# Patient Record
Sex: Male | Born: 1965 | Race: White | Hispanic: No | Marital: Single | Smoking: Current every day smoker
Health system: Southern US, Community
[De-identification: ages and names within clinical notes are randomized; demographics above are authoritative.]

## PROBLEM LIST (undated history)

## (undated) DIAGNOSIS — C439 Malignant melanoma of skin, unspecified: Secondary | ICD-10-CM

## (undated) DIAGNOSIS — M1990 Unspecified osteoarthritis, unspecified site: Secondary | ICD-10-CM

## (undated) DIAGNOSIS — C801 Malignant (primary) neoplasm, unspecified: Secondary | ICD-10-CM

## (undated) HISTORY — PX: WISDOM TOOTH EXTRACTION: SHX21

## (undated) HISTORY — PX: TONSILLECTOMY: SUR1361

---

## 1999-06-15 ENCOUNTER — Emergency Department (HOSPITAL_COMMUNITY): Admission: EM | Admit: 1999-06-15 | Discharge: 1999-06-15 | Payer: Self-pay | Admitting: Emergency Medicine

## 2015-08-14 ENCOUNTER — Emergency Department (HOSPITAL_COMMUNITY)
Admission: EM | Admit: 2015-08-14 | Discharge: 2015-08-14 | Disposition: A | Discharge status: Home / Self Care | Payer: Medicaid Other | Attending: Dermatology | Admitting: Dermatology

## 2015-08-14 ENCOUNTER — Emergency Department (HOSPITAL_COMMUNITY): Payer: Medicaid Other

## 2015-08-14 ENCOUNTER — Encounter (HOSPITAL_COMMUNITY): Payer: Self-pay | Admitting: *Deleted

## 2015-08-14 DIAGNOSIS — Z85820 Personal history of malignant melanoma of skin: Secondary | ICD-10-CM | POA: Diagnosis not present

## 2015-08-14 DIAGNOSIS — F17210 Nicotine dependence, cigarettes, uncomplicated: Secondary | ICD-10-CM | POA: Diagnosis not present

## 2015-08-14 DIAGNOSIS — R0789 Other chest pain: Secondary | ICD-10-CM | POA: Diagnosis present

## 2015-08-14 DIAGNOSIS — Z5321 Procedure and treatment not carried out due to patient leaving prior to being seen by health care provider: Secondary | ICD-10-CM | POA: Diagnosis not present

## 2015-08-14 HISTORY — DX: Malignant melanoma of skin, unspecified: C43.9

## 2015-08-14 HISTORY — DX: Malignant (primary) neoplasm, unspecified: C80.1

## 2015-08-14 LAB — CBC
HCT: 44 % (ref 39.0–52.0)
HEMOGLOBIN: 15.2 g/dL (ref 13.0–17.0)
MCH: 31.2 pg (ref 26.0–34.0)
MCHC: 34.5 g/dL (ref 30.0–36.0)
MCV: 90.3 fL (ref 78.0–100.0)
PLATELETS: 348 10*3/uL (ref 150–400)
RBC: 4.87 MIL/uL (ref 4.22–5.81)
RDW: 13 % (ref 11.5–15.5)
WBC: 14.1 10*3/uL — AB (ref 4.0–10.5)

## 2015-08-14 LAB — BASIC METABOLIC PANEL
Anion gap: 8 (ref 5–15)
BUN: 19 mg/dL (ref 6–20)
CHLORIDE: 105 mmol/L (ref 101–111)
CO2: 25 mmol/L (ref 22–32)
CREATININE: 0.97 mg/dL (ref 0.61–1.24)
Calcium: 9.6 mg/dL (ref 8.9–10.3)
GFR calc non Af Amer: >60 mL/min (ref >60)
Glucose, Bld: 101 mg/dL — ABNORMAL HIGH (ref 65–99)
Potassium: 3.7 mmol/L (ref 3.5–5.1)
SODIUM: 138 mmol/L (ref 135–145)

## 2015-08-14 LAB — I-STAT TROPONIN, ED: TROPONIN I, POC: 0 ng/mL (ref 0.00–0.08)

## 2015-08-14 NOTE — ED Notes (Signed)
Pt stated that he has a babysitter at home with his children and needs to go home. Pt encouraged to come back if his pain came back

## 2015-08-14 NOTE — ED Notes (Signed)
Pt arrives to ED c/o severe chest pain starting at 1730. States it feels like pressure and states he also has cotton mouth.

## 2015-08-28 ENCOUNTER — Emergency Department
Admission: EM | Admit: 2015-08-28 | Discharge: 2015-08-28 | Disposition: A | Discharge status: Home / Self Care | Payer: Medicaid Other | Attending: Emergency Medicine | Admitting: Emergency Medicine

## 2015-08-28 ENCOUNTER — Encounter: Payer: Self-pay | Admitting: Emergency Medicine

## 2015-08-28 DIAGNOSIS — Z85828 Personal history of other malignant neoplasm of skin: Secondary | ICD-10-CM | POA: Diagnosis not present

## 2015-08-28 DIAGNOSIS — F17210 Nicotine dependence, cigarettes, uncomplicated: Secondary | ICD-10-CM | POA: Insufficient documentation

## 2015-08-28 DIAGNOSIS — T7840XA Allergy, unspecified, initial encounter: Secondary | ICD-10-CM | POA: Diagnosis present

## 2015-08-28 DIAGNOSIS — L509 Urticaria, unspecified: Secondary | ICD-10-CM | POA: Diagnosis not present

## 2015-08-28 MED ORDER — DEXAMETHASONE SODIUM PHOSPHATE 10 MG/ML IJ SOLN
10.0000 mg | Freq: Once | INTRAMUSCULAR | Status: AC
  Administered 2015-08-28: 10 mg via INTRAVENOUS
  Filled 2015-08-28: qty 1

## 2015-08-28 NOTE — ED Notes (Addendum)
Pt presents to ED with c/o sudden rash/redness over the back, chest and abdomen. States unknown reason. Pt taken 50mg  benadryl prior to arrival.

## 2015-08-28 NOTE — ED Provider Notes (Signed)
[facility] Emergency Department Provider Note  ____________________________________________  Time seen: Approximately 1:52 AM  I have reviewed the triage vital signs and the nursing notes.   HISTORY  Chief Complaint Allergic Reaction    HPI Vincent Hoover. is a 50 y.o. male with history of chest wall pain and he was recently diagnosed with possible pneumonia at [facility] ED who presents for evaluation of an itchy red rash on his torso and arms and legs.  He was prescribed Levaquin at [facility] for the pneumonia and is on his ninth day of treatment.  He noticed that his hands were itchy yesterday but he did not see a specific rash.  Tonight he took his shirt off and was very surprised to see what looked like hives on his chest and back as well as down his arms and his legs.  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 He denies shortness of breath, chest pain, abdominal pain, nausea, vomiting, diarrhea.  He states that the itching is moderate and it was helped by taking some Benadryl 50 mg by mouth before coming to the emergency department.  He has never had a reaction to medications in the past.  He denies pain.  He occasionally still has some chest wall pain similar to that which he was evaluated for at [facility] but nothing new or different.   Past Medical History  Diagnosis Date  . Cancer ([place])   . Melanoma ([place])     There are no active problems to display for this patient.   History reviewed. No pertinent past surgical history.  No current outpatient prescriptions on file.  Allergies Bee venom  History reviewed. No pertinent family history.  Social History Social History  Substance Use Topics  . Smoking status: Current Every Day Smoker -- 0.50 packs/day    Types: Cigarettes  . Smokeless tobacco: None  . Alcohol Use: No    Review of  Systems Constitutional: No fever/chills Eyes: No visual changes. ENT: No sore throat. Cardiovascular: Occasional chest wall pain on the left side (chronic) Respiratory: Denies shortness of breath. Gastrointestinal: No abdominal pain.  No nausea, no vomiting.  No diarrhea.  No constipation. Genitourinary: Negative for dysuria. Musculoskeletal: Negative for back pain. Skin: itchy red rash on torso and extremities Neurological: Negative for headaches, focal weakness or numbness.  10-point ROS otherwise negative.  ____________________________________________   PHYSICAL EXAM:  VITAL SIGNS: ED Triage Vitals  Enc Vitals Group     BP 08/28/15 0130 122/91 mmHg     Pulse --      Resp 08/28/15 0130 21     Temp 08/28/15 0130 99.4 F (37.4 C)     Temp Source 08/28/15 0130 Oral     SpO2 08/28/15 0130 98 %     Weight 08/28/15 0130 160 lb (72.576 kg)     Height 08/28/15 0130 5\' 11"  (1.803 m)     Head Cir --      Peak Flow --      Pain Score 08/28/15 0049 0     Pain Loc --      Pain Edu? --      Excl. in [place]? --     Constitutional: Alert and oriented. Well appearing and in no acute distress. Eyes: Conjunctivae are normal. PERRL. EOMI. Head: Atraumatic. Nose: No congestion/rhinnorhea. Mouth/Throat: Mucous membranes are moist.  Oropharynx non-erythematous.  Neck: No stridor.  No meningeal signs.   Cardiovascular: Normal rate, regular rhythm. Good peripheral circulation. Grossly normal heart sounds.   Respiratory: Normal respiratory effort.  No retractions. Lungs CTAB. Gastrointestinal: Soft and nontender. No distention.  Musculoskeletal: No lower extremity tenderness nor edema. No gross deformities of extremities. Neurologic:  Normal speech and language. No gross focal neurologic deficits are appreciated.  Skin:  Skin is warm, dry and intact. Urticarial rash on the anterior and posterior torso as well as on his extremities.  Palms are spared.  There are no target lesions.  The rash is  nontender.  There is no specific pattern to the rash. Psychiatric: Mood and affect are normal. Speech and behavior are normal.  ____________________________________________   LABS (all labs ordered are listed, but only abnormal results are displayed)  Labs Reviewed - No data to display ____________________________________________  EKG  None ____________________________________________  RADIOLOGY   No results found.  ____________________________________________   PROCEDURES  Procedure(s) performed:   Procedures   ____________________________________________   INITIAL IMPRESSION / ASSESSMENT AND PLAN / ED COURSE  Pertinent labs & imaging results that were available during my care of the patient were reviewed by me and considered in my medical decision making (see chart for details).  The patient does not meet criteria for anaphylaxis.  He is having an allergic urticarial rash, possibly to the Levaquin, although the exact allergen is unknown.  I encouraged him to not take his final dose of Levaquin, to take over-the-counter Benadryl as needed, and follow up with his primary care doctor at the next available opportunity.  I will give him a single dose of Decadron here that should help suppress his reaction for the next several days.    I gave my usual and customary return precautions.  He understands and agrees with the plan.   ____________________________________________  FINAL CLINICAL IMPRESSION(S) / ED DIAGNOSES  Final diagnoses:  Urticaria  Allergic reaction, initial encounter     MEDICATIONS GIVEN DURING THIS VISIT:  Medications  dexamethasone (DECADRON) injection 10 mg (10 mg Intravenous Given 08/28/15 0214)     NEW OUTPATIENT MEDICATIONS STARTED DURING THIS VISIT:  There are no discharge medications for this patient.     Note:  This document was prepared using Dragon voice recognition software and may include unintentional dictation  errors.   Hinda Kehr, MD 08/28/15 [id number]

## 2015-08-28 NOTE — ED Notes (Signed)
MD at bedside. 

## 2015-08-28 NOTE — Discharge Instructions (Signed)
As we discussed, we do not know exactly what caused to your allergic rash, but we recommend that you do not take the final dose of antibiotics just in case that is the cause of your allergic reaction.  Please use over-the-counter Benadryl as needed and according to label instructions for the next several days.  We gave you a one-time dose of Decadron 10 mg IV which should help with the allergic reaction of the next several days.  Please follow up as an outpatient or return to the emergency department if you develop new or worsening symptoms that concern you.   Hives Hives are itchy, red, swollen areas of the skin. They can vary in size and location on your body. Hives can come and go for hours or several days (acute hives) or for several weeks (chronic hives). Hives do not spread from person to person (noncontagious). They may get worse with scratching, exercise, and emotional stress. CAUSES   Allergic reaction to food, additives, or drugs.  Infections, including the common cold.  Illness, such as vasculitis, lupus, or thyroid disease.  Exposure to sunlight, heat, or cold.  Exercise.  Stress.  Contact with chemicals. SYMPTOMS   Red or white swollen patches on the skin. The patches may change size, shape, and location quickly and repeatedly.  Itching.  Swelling of the hands, feet, and face. This may occur if hives develop deeper in the skin. DIAGNOSIS  Your caregiver can usually tell what is wrong by performing a physical exam. Skin or blood tests may also be done to determine the cause of your hives. In some cases, the cause cannot be determined. TREATMENT  Mild cases usually get better with medicines such as antihistamines. Severe cases may require an emergency epinephrine injection. If the cause of your hives is known, treatment includes avoiding that trigger.  HOME CARE INSTRUCTIONS   Avoid causes that trigger your hives.  Take antihistamines as directed by your caregiver to  reduce the severity of your hives. Non-sedating or low-sedating antihistamines are usually recommended. Do not drive while taking an antihistamine.  Take any other medicines prescribed for itching as directed by your caregiver.  Wear loose-fitting clothing.  Keep all follow-up appointments as directed by your caregiver. SEEK MEDICAL CARE IF:   You have persistent or severe itching that is not relieved with medicine.  You have painful or swollen joints. SEEK IMMEDIATE MEDICAL CARE IF:   You have a fever.  Your tongue or lips are swollen.  You have trouble breathing or swallowing.  You feel tightness in the throat or chest.  You have abdominal pain. These problems may be the first sign of a life-threatening allergic reaction. Call your local emergency services (911 in [place] MAKE SURE YOU:   Understand these instructions.  Will watch your condition.  Will get help right away if you are not doing well or get worse.   This information is not intended to replace advice given to you by your health care provider. Make sure you discuss any questions you have with your health care provider.   Document Released: 02/01/2005 Document Revised: 02/06/2013 Document Reviewed: 04/27/2011 Elsevier Interactive Patient Education [employer].

## 2015-08-28 NOTE — ED Notes (Signed)

## 2015-10-06 ENCOUNTER — Emergency Department: Payer: Medicaid Other

## 2015-10-06 ENCOUNTER — Inpatient Hospital Stay
Admission: EM | Admit: 2015-10-06 | Discharge: 2015-10-13 | DRG: 871 | Disposition: A | Discharge status: Home / Self Care | Payer: Medicaid Other | Attending: Specialist | Admitting: Specialist

## 2015-10-06 ENCOUNTER — Inpatient Hospital Stay: Payer: Medicaid Other

## 2015-10-06 DIAGNOSIS — J439 Emphysema, unspecified: Secondary | ICD-10-CM | POA: Diagnosis present

## 2015-10-06 DIAGNOSIS — J90 Pleural effusion, not elsewhere classified: Secondary | ICD-10-CM

## 2015-10-06 DIAGNOSIS — J9621 Acute and chronic respiratory failure with hypoxia: Secondary | ICD-10-CM | POA: Diagnosis present

## 2015-10-06 DIAGNOSIS — Z85820 Personal history of malignant melanoma of skin: Secondary | ICD-10-CM | POA: Diagnosis not present

## 2015-10-06 DIAGNOSIS — Z833 Family history of diabetes mellitus: Secondary | ICD-10-CM | POA: Diagnosis not present

## 2015-10-06 DIAGNOSIS — J189 Pneumonia, unspecified organism: Secondary | ICD-10-CM | POA: Diagnosis present

## 2015-10-06 DIAGNOSIS — K5900 Constipation, unspecified: Secondary | ICD-10-CM | POA: Diagnosis present

## 2015-10-06 DIAGNOSIS — G8929 Other chronic pain: Secondary | ICD-10-CM | POA: Diagnosis present

## 2015-10-06 DIAGNOSIS — Z8701 Personal history of pneumonia (recurrent): Secondary | ICD-10-CM | POA: Diagnosis not present

## 2015-10-06 DIAGNOSIS — Z9889 Other specified postprocedural states: Secondary | ICD-10-CM

## 2015-10-06 DIAGNOSIS — R0600 Dyspnea, unspecified: Secondary | ICD-10-CM | POA: Diagnosis present

## 2015-10-06 DIAGNOSIS — A419 Sepsis, unspecified organism: Principal | ICD-10-CM | POA: Diagnosis present

## 2015-10-06 DIAGNOSIS — M1990 Unspecified osteoarthritis, unspecified site: Secondary | ICD-10-CM | POA: Diagnosis present

## 2015-10-06 DIAGNOSIS — J869 Pyothorax without fistula: Secondary | ICD-10-CM | POA: Diagnosis present

## 2015-10-06 DIAGNOSIS — M549 Dorsalgia, unspecified: Secondary | ICD-10-CM | POA: Diagnosis present

## 2015-10-06 DIAGNOSIS — J918 Pleural effusion in other conditions classified elsewhere: Secondary | ICD-10-CM | POA: Diagnosis present

## 2015-10-06 DIAGNOSIS — F17210 Nicotine dependence, cigarettes, uncomplicated: Secondary | ICD-10-CM | POA: Diagnosis present

## 2015-10-06 DIAGNOSIS — J939 Pneumothorax, unspecified: Secondary | ICD-10-CM

## 2015-10-06 HISTORY — DX: Unspecified osteoarthritis, unspecified site: M19.90

## 2015-10-06 LAB — BODY FLUID CELL COUNT WITH DIFFERENTIAL
EOS FL: 0 %
Lymphs, Fluid: 9 %
Monocyte-Macrophage-Serous Fluid: 2 %
Neutrophil Count, Fluid: 89 %
Other Cells, Fluid: 0 %
WBC FLUID: 2200 uL

## 2015-10-06 LAB — BASIC METABOLIC PANEL
Anion gap: 10 (ref 5–15)
BUN: 11 mg/dL (ref 6–20)
CALCIUM: 8.7 mg/dL — AB (ref 8.9–10.3)
CHLORIDE: 101 mmol/L (ref 101–111)
CO2: 23 mmol/L (ref 22–32)
CREATININE: 0.77 mg/dL (ref 0.61–1.24)
GFR calc Af Amer: >60 mL/min (ref >60)
GFR calc non Af Amer: >60 mL/min (ref >60)
Glucose, Bld: 157 mg/dL — ABNORMAL HIGH (ref 65–99)
Potassium: 3.2 mmol/L — ABNORMAL LOW (ref 3.5–5.1)
SODIUM: 134 mmol/L — AB (ref 135–145)

## 2015-10-06 LAB — CBC WITH DIFFERENTIAL/PLATELET
BASOS ABS: 0.2 10*3/uL — AB (ref 0–0.1)
BASOS PCT: 1 %
EOS ABS: 0.6 10*3/uL (ref 0–0.7)
Eosinophils Relative: 3 %
HCT: 40 % (ref 40.0–52.0)
HEMOGLOBIN: 13.6 g/dL (ref 13.0–18.0)
LYMPHS ABS: 2.1 10*3/uL (ref 1.0–3.6)
LYMPHS PCT: 10 %
MCH: 30.5 pg (ref 26.0–34.0)
MCHC: 34 g/dL (ref 32.0–36.0)
MCV: 89.9 fL (ref 80.0–100.0)
MONO ABS: 2.3 10*3/uL — AB (ref 0.2–1.0)
Monocytes Relative: 11 %
NEUTROS ABS: 15.8 10*3/uL — AB (ref 1.4–6.5)
Neutrophils Relative %: 75 %
Platelets: 474 10*3/uL — ABNORMAL HIGH (ref 150–440)
RBC: 4.46 MIL/uL (ref 4.40–5.90)
RDW: 13.5 % (ref 11.5–14.5)
WBC: 21 10*3/uL — ABNORMAL HIGH (ref 3.8–10.6)

## 2015-10-06 LAB — LACTATE DEHYDROGENASE, PLEURAL OR PERITONEAL FLUID: LD, Fluid: 529 U/L — ABNORMAL HIGH (ref 3–23)

## 2015-10-06 LAB — LACTIC ACID, PLASMA: Lactic Acid, Venous: 0.8 mmol/L (ref 0.5–1.9)

## 2015-10-06 LAB — RAPID HIV SCREEN (HIV 1/2 AB+AG)
HIV 1/2 ANTIBODIES: NONREACTIVE
HIV-1 P24 ANTIGEN - HIV24: NONREACTIVE

## 2015-10-06 LAB — TROPONIN I: Troponin I: <0.03 ng/mL (ref <0.03)

## 2015-10-06 LAB — AMYLASE, BODY FLUID: Amylase, Fluid: 29 U/L

## 2015-10-06 LAB — LACTATE DEHYDROGENASE: LDH: 148 U/L (ref 98–192)

## 2015-10-06 LAB — GLUCOSE, SEROUS FLUID: GLUCOSE FL: 62 mg/dL

## 2015-10-06 LAB — BRAIN NATRIURETIC PEPTIDE: B NATRIURETIC PEPTIDE 5: 66 pg/mL (ref 0.0–100.0)

## 2015-10-06 MED ORDER — IOPAMIDOL (ISOVUE-370) INJECTION 76%
75.0000 mL | Freq: Once | INTRAVENOUS | Status: AC | PRN
  Administered 2015-10-06: 75 mL via INTRAVENOUS

## 2015-10-06 MED ORDER — OXYCODONE-ACETAMINOPHEN 5-325 MG PO TABS
1.0000 | ORAL_TABLET | ORAL | Status: DC | PRN
  Administered 2015-10-07 – 2015-10-12 (×22): 1 via ORAL
  Filled 2015-10-06 (×22): qty 1

## 2015-10-06 MED ORDER — SODIUM CHLORIDE 0.9 % IV SOLN
3.0000 g | Freq: Four times a day (QID) | INTRAVENOUS | Status: DC
  Administered 2015-10-06 – 2015-10-13 (×27): 3 g via INTRAVENOUS
  Filled 2015-10-06 (×30): qty 3

## 2015-10-06 MED ORDER — MORPHINE SULFATE (PF) 2 MG/ML IV SOLN
2.0000 mg | INTRAVENOUS | Status: DC | PRN
  Administered 2015-10-06 – 2015-10-07 (×3): 2 mg via INTRAVENOUS
  Filled 2015-10-06 (×3): qty 1

## 2015-10-06 MED ORDER — OXYCODONE-ACETAMINOPHEN 5-325 MG PO TABS
1.0000 | ORAL_TABLET | Freq: Four times a day (QID) | ORAL | Status: DC | PRN
  Administered 2015-10-06: 1 via ORAL
  Filled 2015-10-06: qty 1

## 2015-10-06 MED ORDER — OXYCODONE-ACETAMINOPHEN 5-325 MG PO TABS: 1.0000 | ORAL_TABLET | Freq: Four times a day (QID) | ORAL | Status: DC | PRN

## 2015-10-06 MED ORDER — VANCOMYCIN HCL IN DEXTROSE 1-5 GM/200ML-% IV SOLN
1000.0000 mg | Freq: Once | INTRAVENOUS | Status: AC
  Administered 2015-10-06: 1000 mg via INTRAVENOUS
  Filled 2015-10-06: qty 200

## 2015-10-06 MED ORDER — KETOROLAC TROMETHAMINE 30 MG/ML IJ SOLN
30.0000 mg | Freq: Once | INTRAMUSCULAR | Status: AC
  Administered 2015-10-06: 30 mg via INTRAVENOUS
  Filled 2015-10-06: qty 1

## 2015-10-06 MED ORDER — HEPARIN SODIUM (PORCINE) 5000 UNIT/ML IJ SOLN
5000.0000 [IU] | Freq: Three times a day (TID) | INTRAMUSCULAR | Status: DC
  Administered 2015-10-06 – 2015-10-07 (×2): 5000 [IU] via SUBCUTANEOUS
  Filled 2015-10-06 (×2): qty 1

## 2015-10-06 MED ORDER — IPRATROPIUM-ALBUTEROL 0.5-2.5 (3) MG/3ML IN SOLN
3.0000 mL | RESPIRATORY_TRACT | Status: DC
  Administered 2015-10-06 – 2015-10-07 (×6): 3 mL via RESPIRATORY_TRACT
  Filled 2015-10-06 (×6): qty 3

## 2015-10-06 MED ORDER — PIPERACILLIN-TAZOBACTAM 3.375 G IVPB
3.3750 g | Freq: Once | INTRAVENOUS | Status: AC
  Administered 2015-10-06: 3.375 g via INTRAVENOUS
  Filled 2015-10-06: qty 50

## 2015-10-06 MED ORDER — LORAZEPAM 2 MG/ML IJ SOLN
1.0000 mg | Freq: Once | INTRAMUSCULAR | Status: AC
  Administered 2015-10-06: 1 mg via INTRAVENOUS
  Filled 2015-10-06: qty 1

## 2015-10-06 NOTE — ED Triage Notes (Signed)
Pt c/o cough with chest pain and increased SOB since last Tuesday and was seen at [facility] and put on abx states he is getting worse instead of better, states he was treated for pneumonia in july

## 2015-10-06 NOTE — ED Provider Notes (Signed)
[facility] Emergency Department Provider Note        Time seen: ----------------------------------------- 8:41 AM on 10/06/2015 -----------------------------------------    I have reviewed the triage vital signs and the nursing notes.   HISTORY  Chief Complaint Chest Pain and Shortness of Breath    HPI Vincent Hoover. is a 50 y.o. male who presents to ER for cough with left-sided chest pain radiates into his shoulder and increased shortness of breath since last Tuesday. Patient states he was seen at [facility] on Wednesday of last week and placed on antibiotics but he states he is getting worse instead of better. States she was treated for pneumonia in July. He denies having fevers, denies purulent cough. Patient states breathing hurts his left side. Nothing has made his symptoms better, tramadol does not help.Patient states for the last 2 weeks he's had night sweats and referred pain from his chest to his left shoulder. He has also lost about 7 pounds over the last 2 weeks.   Past Medical History:  Diagnosis Date  . Cancer ([place])   . Melanoma ([place])     There are no active problems to display for this patient.   Past Surgical History:  Procedure Laterality Date  . TONSILLECTOMY    . WISDOM TOOTH EXTRACTION      Allergies Bee venom  Social History Social History  Substance Use Topics  . Smoking status: Current Every Day Smoker    Packs/day: 0.50    Types: Cigarettes  . Smokeless tobacco: Never Used  . Alcohol use No    Review of Systems Constitutional: Negative for fever. Cardiovascular:Positive for left-sided chest pain Respiratory: Positive shortness of breath, cough Gastrointestinal: Negative for abdominal pain, vomiting and diarrhea. Genitourinary: Negative for dysuria. Musculoskeletal: Negative for back pain. Skin: Negative for rash. Neurological: Negative for headaches, focal weakness or numbness.  10-point ROS otherwise  negative.  ____________________________________________   PHYSICAL EXAM:  VITAL SIGNS: ED Triage Vitals [10/06/15 0828]  Enc Vitals Group     BP (!) 148/92     Pulse Rate (!) 132     Resp (!) 26     Temp 99.1 F (37.3 C)     Temp Source Oral     SpO2 92 %     Weight 160 lb (72.6 kg)     Height 5\' 11"  (1.803 m)     Head Circumference      Peak Flow      Pain Score 10     Pain Loc      Pain Edu?      Excl. in [place]?     Constitutional: Alert and oriented. Anxious, mild to moderate distress Eyes: Conjunctivae are normal. PERRL. Normal extraocular movements. ENT   Head: Normocephalic and atraumatic.   Nose: No congestion/rhinnorhea.   Mouth/Throat: Mucous membranes are moist.   Neck: No stridor. Cardiovascular: Normal rate, regular rhythm. No murmurs, rubs, or gallops. Respiratory: Tachypnea with diminished breath sounds on the left, occasional rhonchi. Gastrointestinal: Soft and nontender. Normal bowel sounds Musculoskeletal: Nontender with normal range of motion in all extremities. No lower extremity tenderness nor edema. Neurologic:  Normal speech and language. No gross focal neurologic deficits are appreciated.  Skin:  Skin is warm, dry and intact. No rash noted. Psychiatric: Anxious mood and affect. ____________________________________________  EKG: Interpreted by me. Sinus tachycardia with a rate of 129 bpm, normal PR interval, normal QRS, normal QT interval. Normal axis.  ____________________________________________  ED COURSE:  Pertinent labs &  imaging results that were available during my care of the patient were reviewed by me and considered in my medical decision making (see chart for details). Clinical Course  Patient presents to ER with dyspnea of uncertain etiology. We will check basic labs and imaging. He'll receive IV Ativan and Toradol.  Procedures ____________________________________________   LABS (pertinent positives/negatives)  Labs  Reviewed  CBC WITH DIFFERENTIAL/PLATELET - Abnormal; Notable for the following:       Result Value   WBC 21.0 (*)    Platelets 474 (*)    Neutro Abs 15.8 (*)    Monocytes Absolute 2.3 (*)    Basophils Absolute 0.2 (*)    All other components within normal limits  BASIC METABOLIC PANEL - Abnormal; Notable for the following:    Sodium 134 (*)    Potassium 3.2 (*)    Glucose, Bld 157 (*)    Calcium 8.7 (*)    All other components within normal limits  CULTURE, BLOOD (ROUTINE X 2)  CULTURE, BLOOD (ROUTINE X 2)  BRAIN NATRIURETIC PEPTIDE  TROPONIN I  RAPID HIV SCREEN (HIV 1/2 AB+AG)  LACTATE DEHYDROGENASE  LACTIC ACID, PLASMA  LACTIC ACID, PLASMA  LACTATE DEHYDROGENASE  RAPID HIV SCREEN (HIV 1/2 AB+AG)  CRITICAL CARE Performed by: Earleen Newport   Total critical care time: 30 minutes  Critical care time was exclusive of separately billable procedures and treating other patients.  Critical care was necessary to treat or prevent imminent or life-threatening deterioration.  Critical care was time spent personally by me on the following activities: development of treatment plan with patient and/or surrogate as well as nursing, discussions with consultants, evaluation of patient's response to treatment, examination of patient, obtaining history from patient or surrogate, ordering and performing treatments and interventions, ordering and review of laboratory studies, ordering and review of radiographic studies, pulse oximetry and re-evaluation of patient's condition.   RADIOLOGY Images were viewed by me  Chest x-ray IMPRESSION: Interval development of large left pleural effusion with probable associated airspace consolidation, obscuring 2/3 of the left hemithorax.  No evidence of pneumothorax. CT of the chest with contrast:  IMPRESSION: Large left pleural effusion appears loculated.  Patchy airspace disease in all lobes of the right lung most consistent with  pneumonia.  Emphysema.  Small right hilar lymph node is likely reactive. ____________________________________________  FINAL ASSESSMENT AND PLAN  Dyspnea, large left pleural effusion, Pneumonia  Plan: Patient with labs and imaging as dictated above. Patient presents with dyspnea, large pleural effusion is noted on the left side of his chest. We will obtain CT imaging for further evaluation. Of concern for etiology other than infection such as cancer. He will likely need a pleurocentesis. We have ordered cultures, given IV vancomycin and Zosyn. He will also need pulmonology consultation.   Earleen Newport, MD   Note: This dictation was prepared with Dragon dictation. Any transcriptional errors that result from this process are unintentional    Earleen Newport, MD 10/06/15 1030

## 2015-10-06 NOTE — ED Notes (Signed)
Patient transported to CT 

## 2015-10-06 NOTE — Progress Notes (Signed)
Pharmacy Antibiotic Note  Vincent Hoover. is a 50 y.o. male admitted on 10/06/2015 with pneumonia.  Pharmacy has been consulted for Unasyn dosing.  Plan: Will order Unasyn 3g IV every 6 hours, starting at 1900.  Received one time doses of Vancomycin and Zosyn in the ED.    Height: 5\' 11"  (180.3 cm) Weight: 160 lb (72.6 kg) IBW/kg (Calculated) : 75.3  Temp (24hrs), Avg:100.5 F (38.1 C), Min:99.1 F (37.3 C), Max:101.9 F (38.8 C)   Recent Labs Lab 10/06/15 0842 10/06/15 0956  WBC 21.0*  --   CREATININE 0.77  --   LATICACIDVEN  --  0.8    Estimated Creatinine Clearance: 116 mL/min (by C-G formula based on SCr of 0.8 mg/dL).    Allergies  Allergen Reactions  . Bee Venom Anaphylaxis    Antimicrobials this admission: Vancomycin 8/21 >> 8/21 Zosyn 8/21 >> 8/21 Unasyn 8/21 >>   Dose adjustments this admission: No dose adjustments needed at this time.   Microbiology results: 8/21 BCx: in process 8/21 Sputum: needs to be collected  Thank you for allowing pharmacy to be a part of this patient's care.  Loree Fee 10/06/2015 12:36 PM

## 2015-10-06 NOTE — ED Notes (Signed)
Per [place] RN, Claiborne Billings from [place] will transport patient to [place] for procedure before going to floor.

## 2015-10-06 NOTE — H&P (Signed)
[facility] NAME: Vincent Hoover    MR#:  [id number]  DATE OF BIRTH:  1965-07-10  DATE OF ADMISSION:  10/06/2015  PRIMARY CARE PHYSICIAN: No PCP Per Patient   REQUESTING/REFERRING PHYSICIAN: Gwyndolyn Saxon  CHIEF COMPLAINT:   Chief Complaint  Patient presents with  . Chest Pain  . Shortness of Breath    HISTORY OF PRESENT ILLNESS: Vincent Hoover  is a 50 y.o. male with a known history of Chronic arthritis, chronic back pain, history of melanoma, is a chronic smoker and is in first week of July went to [facility] for left-sided chest pain with breathing on CT scan he was found to have pneumonia with some pleural effusion and was given Levaquin orally and discharged home. After taking 9 tablets of Levaquin he had some hives on his back so he came to emergency room or here and advised to stop taking Levaquin as he almost finished the course. Because of his chest pain he also had addressed treadmill stress test done on 27th July at [facility] and which was negative for any stress-induced changes or coronary artery disease. 5 days ago again had pain in his left side of the chest so he went to [facility] emergency room where CT scan showed pleural effusion and pneumonia, so he was given azithromycin for 5 days and sent home. He finished the course and still continued to have the chest pain so came to emergency room here today. In ER her CT scan of the chest showed left-sided moderate possibly loculated pleural effusion and a right sided infiltrate, compared to the CT scan in [facility] which had left-sided possible nodule or infiltrate in the lung. He was also having fever, tachycardia and hypoxia with elevated white blood cell count so given his admission.  PAST MEDICAL HISTORY:   Past Medical History:  Diagnosis Date  . Arthritis   . Cancer ([place])   . Melanoma ([place])     PAST SURGICAL HISTORY: Past Surgical History:  Procedure Laterality Date  .  TONSILLECTOMY    . WISDOM TOOTH EXTRACTION      SOCIAL HISTORY:  Social History  Substance Use Topics  . Smoking status: Current Every Day Smoker    Packs/day: 0.50    Types: Cigarettes  . Smokeless tobacco: Never Used  . Alcohol use No    FAMILY HISTORY:  Family History  Problem Relation Age of Onset  . Diabetes Maternal Grandfather   . Cancer Paternal Uncle   . Cancer Paternal Aunt     DRUG ALLERGIES:  Allergies  Allergen Reactions  . Bee Venom Anaphylaxis    REVIEW OF SYSTEMS:   CONSTITUTIONAL: Positive for fever, no fatigue or weakness.  EYES: No blurred or double vision.  EARS, NOSE, AND THROAT: No tinnitus or ear pain.  RESPIRATORY: No cough, some shortness of breath, no wheezing or hemoptysis.  CARDIOVASCULAR: Left-sided chest pain with breathing, no orthopnea, edema.  GASTROINTESTINAL: No nausea, vomiting, diarrhea or abdominal pain.  GENITOURINARY: No dysuria, hematuria.  ENDOCRINE: No polyuria, nocturia,  HEMATOLOGY: No anemia, easy bruising or bleeding SKIN: No rash or lesion. MUSCULOSKELETAL: No joint pain or arthritis.   NEUROLOGIC: No tingling, numbness, weakness.  PSYCHIATRY: No anxiety or depression.   MEDICATIONS AT HOME:  Prior to Admission medications   Not on File      PHYSICAL EXAMINATION:   VITAL SIGNS: Blood pressure 103/88, pulse 100, temperature (!) 101.9 F (38.8 C), temperature source Oral, resp. rate Marland Kitchen)  21, height 5\' 11"  (1.803 m), weight 72.6 kg (160 lb), SpO2 99 %.  GENERAL:  50 y.o.-year-old patient lying in the bed with no acute distress.  EYES: Pupils equal, round, reactive to light and accommodation. No scleral icterus. Extraocular muscles intact.  HEENT: Head atraumatic, normocephalic. Oropharynx and nasopharynx clear.  NECK:  Supple, no jugular venous distention. No thyroid enlargement, no tenderness.  LUNGS: Decreased breath sound on left side lower, no wheezing, some crepitation on the right side. No use of accessory  muscles of respiration.  CARDIOVASCULAR: S1, S2 normal. No murmurs, rubs, or gallops.  ABDOMEN: Soft, nontender, nondistended. Bowel sounds present. No organomegaly or mass.  EXTREMITIES: No pedal edema, cyanosis, or clubbing.  NEUROLOGIC: Cranial nerves II through XII are intact. Muscle strength 5/5 in all extremities. Sensation intact. Gait not checked.  PSYCHIATRIC: The patient is alert and oriented x 3.  SKIN: No obvious rash, lesion, or ulcer.   LABORATORY PANEL:   CBC  Recent Labs Lab 10/06/15 0842  WBC 21.0*  HGB 13.6  HCT 40.0  PLT 474*  MCV 89.9  MCH 30.5  MCHC 34.0  RDW 13.5  LYMPHSABS 2.1  MONOABS 2.3*  EOSABS 0.6  BASOSABS 0.2*   ------------------------------------------------------------------------------------------------------------------  Chemistries   Recent Labs Lab 10/06/15 0842  NA 134*  K 3.2*  CL 101  CO2 23  GLUCOSE 157*  BUN 11  CREATININE 0.77  CALCIUM 8.7*   ------------------------------------------------------------------------------------------------------------------ estimated creatinine clearance is 116 mL/min (by C-G formula based on SCr of 0.8 mg/dL). ------------------------------------------------------------------------------------------------------------------ No results for input(s): TSH, T4TOTAL, T3FREE, THYROIDAB in the last 72 hours.  Invalid input(s): FREET3   Coagulation profile No results for input(s): INR, PROTIME in the last 168 hours. ------------------------------------------------------------------------------------------------------------------- No results for input(s): DDIMER in the last 72 hours. -------------------------------------------------------------------------------------------------------------------  Cardiac Enzymes  Recent Labs Lab 10/06/15 0842  TROPONINI <0.03   ------------------------------------------------------------------------------------------------------------------ Invalid  input(s): POCBNP  ---------------------------------------------------------------------------------------------------------------  Urinalysis No results found for: COLORURINE, APPEARANCEUR, LABSPEC, PHURINE, GLUCOSEU, HGBUR, BILIRUBINUR, KETONESUR, PROTEINUR, UROBILINOGEN, NITRITE, LEUKOCYTESUR   RADIOLOGY: Dg Chest 2 View  Result Date: 10/06/2015 CLINICAL DATA:  Left-sided chest pain and shortness of breath and fever for 5 days. EXAM: CHEST  2 VIEW COMPARISON:  08/14/2015 FINDINGS: The cardiomediastinal silhouette is largely obscured by a large left pleural effusion and/or airspace consolidation, obscuring most of the left hemithorax. A rounded density overlying the anterior mediastinum on the lateral view likely represents loculated effusion. There is no evidence of pneumothorax. The right lung is clear. Osseous structures are without acute abnormality. Soft tissues are grossly normal. IMPRESSION: Interval development of large left pleural effusion with probable associated airspace consolidation, obscuring 2/3 of the left hemithorax. No evidence of pneumothorax. Electronically Signed   By: Fidela Salisbury M.D.   On: 10/06/2015 09:27   Ct Chest W Contrast  Result Date: 10/06/2015 CLINICAL DATA:  Shortness of breath and cough for 1 week. Pleural effusion on chest radiographs today. EXAM: CT CHEST WITH CONTRAST TECHNIQUE: Multidetector CT imaging of the chest was performed during intravenous contrast administration. CONTRAST:  75 cc Isovue 370. COMPARISON:  PA and lateral chest earlier today and 08/14/2015. FINDINGS: Cardiovascular: Heart size is normal. No pericardial effusion. No calcific atherosclerotic vascular disease is identified. Mediastinum/Nodes: A right hilar lymph node on image 70 measures 1.2 cm short axis dimension. Small mediastinal lymph nodes which are not pathologically enlarged by CT size criteria are seen. Lungs/Pleura: Large left pleural effusion appears at least partially  loculated. No right pleural effusion is identified.  Patchy airspace disease is seen in all lobes of the right lung. Extensive compressive atelectasis on the left is identified. Mucous is noted within the trachea. Centrilobular emphysematous change is seen. Upper Abdomen: Unremarkable. Musculoskeletal: No bony abnormality is identified. IMPRESSION: Large left pleural effusion appears loculated. Patchy airspace disease in all lobes of the right lung most consistent with pneumonia. Emphysema. Small right hilar lymph node is likely reactive. Electronically Signed   By: Inge Rise M.D.   On: 10/06/2015 10:25    EKG: Orders placed or performed during the hospital encounter of 10/06/15  . EKG 12-Lead  . EKG 12-Lead  . EKG 12-Lead  . EKG 12-Lead  . ED EKG  . ED EKG    IMPRESSION AND PLAN:  * Acute hypoxic respiratory failure with sepsis secondary to recurrent pneumonia and pleural effusion   He was already treated with a course of Levaquin for 9 days and azithromycin for 5 days in last 6 weeks.   I will treat with IV Unasyn for now and send him for thoracentesis to get the sample from pleural effusion. We may help to set him up with the follow-up in pulmonary department after discharge for dissolution of his pneumonia and to check for any underlying tumors.   Pulmonary consult called in inpatient.   Sputum culture was ordered.   Percocet ordered for pain control.  * Left-sided chest pain   He has a negative treadmill stress test done at [facility] on 27th of July, 2017.   Pain is secondary to his pleural effusion, give Percocet for symptomatic control.  * Active smoking   Counseled to quit smoking for 4 minutes.   All the records are reviewed and case discussed with ED provider. Management plans discussed with the patient, family and they are in agreement.  CODE STATUS: Full code Code Status History    This patient does not have a recorded code status. Please follow your organizational  policy for patients in this situation.       TOTAL TIME TAKING CARE OF THIS PATIENT: 50 minutes.    Vaughan Basta M.D on 10/06/2015   Between 7am to 6pm - Pager - [phone]  After 6pm go to [email or web address] - password [email or web address] [facility]  Office  [phone]  CC: Primary care physician; No PCP Per Patient   Note: This dictation was prepared with Dragon dictation along with smaller phrase technology. Any transcriptional errors that result from this process are unintentional.

## 2015-10-06 NOTE — Consult Note (Signed)
Name: Vincent Hoover. MRN: [id number] DOB: 1965/07/11    ADMISSION DATE:  10/06/2015 CONSULTATION DATE:  10/06/15  REFERRING MD :  Anselm Jungling  CHIEF COMPLAINT:  Recurrent PNA And pleural effussion  BRIEF PATIENT DESCRIPTION: 50 year old male with recurrent PNA and left sided pleural effussion  SIGNIFICANT EVENTS  8/21 Patient admitted on 8/21 with recurrent PNA and  pleural effusion  STUDIES:  8/21 Ultrasound guided thoracentesis>> left thoracentesis yielding 1 L ofpleural fluid. 8/21 CT chest with contrast>>Large left pleural effusion appears loculated.Patchy airspace disease in all lobes of the right lung mostconsistent with pneumonia. 8/21 Pleural fluid culture>>    HISTORY OF PRESENT ILLNESS:  Vincent Hoover is a 50 yo male with a known Hx of melanoma, chronic back pain, current smoker. Patient was diagnosed and treated for PNA and pleural efusion with levaquin.  Towards the end of his antibiotic course he had some hives, therefore he was advised to stop taking Levaquin. Patient started experiencing some left sided chest pain again 5 days ago, CT of the chest at [facility] was concerning for left sided loculated pleural effusion and PNA.  He was given a 5 days course of azithromycin, he finished the course but continued to have chest pain so therefore he decided to come to [facility] on 8/21. His CT of the chest was concerning for left sided loculated pleural effusion and right sided infilterate.  Patient underwent left sided thoracentesis and removed 1l of pleural fluid. Patient also having fever , tachycardia and hypoxia with leukocytosis. Therefore PCCM team was consult for further management PAST MEDICAL HISTORY :   has a past medical history of Arthritis; Cancer ([place]); and Melanoma ([place]).  has a past surgical history that includes Tonsillectomy and Wisdom tooth extraction. Prior to Admission medications   Not on File   Allergies  Allergen Reactions  . Bee Venom Anaphylaxis     FAMILY HISTORY:  family history includes Cancer in his paternal aunt and paternal uncle; Diabetes in his maternal grandfather. SOCIAL HISTORY:  reports that he has been smoking Cigarettes.  He has been smoking about 0.50 packs per day. He has never used smokeless tobacco. He reports that he does not drink alcohol or use drugs.  REVIEW OF SYSTEMS:   Constitutional:  fever, chills, weight loss, malaise/fatigue and diaphoresis.  HENT: Negative for hearing loss, ear pain, nosebleeds, congestion, sore throat, neck pain, tinnitus and ear discharge.   Eyes: Negative for blurred vision, double vision, photophobia, pain, discharge and redness.  Respiratory:cough, hemoptysis, sputum production, shortness of breath, wheezing and stridor.   Cardiovascular:  chest pain, palpitations, orthopnea, claudication, leg swelling and PND.  Gastrointestinal: Negative for heartburn, nausea, vomiting, abdominal pain, diarrhea, constipation, blood in stool and melena.  Genitourinary: Negative for dysuria, urgency, frequency, hematuria and flank pain.  Musculoskeletal: Negative for myalgias, back pain, joint pain and falls.  Skin: Negative for itching and rash.  Neurological: Negative for dizziness, tingling, tremors, sensory change, speech change, focal weakness, seizures, loss of consciousness, weakness and headaches.  Endo/Heme/Allergies: Negative for environmental allergies and polydipsia. Does not bruise/bleed easily.  SUBJECTIVE:  Patient states that he is having left sided chest pain, intermittent fevers, and brownish tinged sputum. VITAL SIGNS: Temp:  [98 F (36.7 C)-101.9 F (38.8 C)] 98.3 F (36.8 C) (08/21 1440) Pulse Rate:  [91-132] 92 (08/21 1440) Resp:  [18-29] 18 (08/21 1440) BP: (103-183)/(61-92) 121/66 (08/21 1440) SpO2:  [92 %-99 %] 95 % (08/21 1605) Weight:  [149 lb 6.4 oz (67.8  kg)-160 lb (72.6 kg)] 149 lb 6.4 oz (67.8 kg) (08/21 1440)  PHYSICAL EXAMINATION: General:  Middle aged white  male, in no acute distress, found on 2l of o2 Neuro:  Awake, alert, oriented, follows command, no focal deficits. HEENT:  Atraumatic, normocephalic, no discharge, no JVD appreciated Cardiovascular:  S1S2, regular, no MRG noted Lungs:  Diminished right lower lobe, no wheezes, crackles or rhonchi noted Abdomen:soft, nontender, active bowel sounds Musculoskeletal:  No inflammation/deformity noted Skin:  Grossly intact   Recent Labs Lab 10/06/15 0842  NA 134*  K 3.2*  CL 101  CO2 23  BUN 11  CREATININE 0.77  GLUCOSE 157*    Recent Labs Lab 10/06/15 0842  HGB 13.6  HCT 40.0  WBC 21.0*  PLT 474*   Dg Chest 1 View  Result Date: 10/06/2015 CLINICAL DATA:  Status post left thoracentesis EXAM: CHEST 1 VIEW COMPARISON:  Film from earlier in the same day FINDINGS: There has been significant decrease in the degree of left pleural effusion although a moderate effusion remains. Patient terminated the procedure prior to complete drainage. Additionally multiple loculations were noted during the drainage which will likely preclude complete evacuation. Patchy atelectatic changes are noted in the bases bilaterally similar to that seen on prior CT examination. Pneumothorax is noted. IMPRESSION: No pneumothorax following left thoracentesis. Significant decrease in pleural fluid is noted although a moderate effusion remains. Electronically Signed   By: Inez Catalina M.D.   On: 10/06/2015 14:27   Dg Chest 2 View  Result Date: 10/06/2015 CLINICAL DATA:  Left-sided chest pain and shortness of breath and fever for 5 days. EXAM: CHEST  2 VIEW COMPARISON:  08/14/2015 FINDINGS: The cardiomediastinal silhouette is largely obscured by a large left pleural effusion and/or airspace consolidation, obscuring most of the left hemithorax. A rounded density overlying the anterior mediastinum on the lateral view likely represents loculated effusion. There is no evidence of pneumothorax. The right lung is clear. Osseous  structures are without acute abnormality. Soft tissues are grossly normal. IMPRESSION: Interval development of large left pleural effusion with probable associated airspace consolidation, obscuring 2/3 of the left hemithorax. No evidence of pneumothorax. Electronically Signed   By: Fidela Salisbury M.D.   On: 10/06/2015 09:27   Ct Chest W Contrast  Result Date: 10/06/2015 CLINICAL DATA:  Shortness of breath and cough for 1 week. Pleural effusion on chest radiographs today. EXAM: CT CHEST WITH CONTRAST TECHNIQUE: Multidetector CT imaging of the chest was performed during intravenous contrast administration. CONTRAST:  75 cc Isovue 370. COMPARISON:  PA and lateral chest earlier today and 08/14/2015. FINDINGS: Cardiovascular: Heart size is normal. No pericardial effusion. No calcific atherosclerotic vascular disease is identified. Mediastinum/Nodes: A right hilar lymph node on image 70 measures 1.2 cm short axis dimension. Small mediastinal lymph nodes which are not pathologically enlarged by CT size criteria are seen. Lungs/Pleura: Large left pleural effusion appears at least partially loculated. No right pleural effusion is identified. Patchy airspace disease is seen in all lobes of the right lung. Extensive compressive atelectasis on the left is identified. Mucous is noted within the trachea. Centrilobular emphysematous change is seen. Upper Abdomen: Unremarkable. Musculoskeletal: No bony abnormality is identified. IMPRESSION: Large left pleural effusion appears loculated. Patchy airspace disease in all lobes of the right lung most consistent with pneumonia. Emphysema. Small right hilar lymph node is likely reactive. Electronically Signed   By: Inge Rise M.D.   On: 10/06/2015 10:25   US Thoracentesis Asp Pleural Space W/img Guide  Result Date: 10/06/2015 INDICATION: Left-sided pleural effusion EXAM: ULTRASOUND GUIDED THORACENTESIS MEDICATIONS: None. ANESTHESIA/SEDATION: None COMPLICATIONS: None  immediate. PROCEDURE: An ultrasound guided thoracentesis was thoroughly discussed with the patient and questions answered. The benefits, risks, alternatives and complications were also discussed. The patient understands and wishes to proceed with the procedure. Written consent was obtained. Ultrasound was performed to localize and mark an adequate pocket of fluid in the right chest. The area was then prepped and draped in the normal sterile fashion. 1% Lidocaine was used for local anesthesia. Under ultrasound guidance a 6 French thoracentesis catheter was introduced. Thoracentesis was performed. The catheter was removed and a dressing applied. FINDINGS: A total of approximately 1 L of clear yellow fluid was removed. A fluid sample was sent for laboratory analysis. Significant loculations were identified. Patient terminated the procedure prior to complete drainage of the fluid. IMPRESSION: Successful ultrasound guided left thoracentesis yielding 1 L of pleural fluid. Electronically Signed   By: Inez Catalina M.D.   On: 10/06/2015 14:25    ASSESSMENT / PLAN:  Acute on chronic respiratory failure related to Pneumonia and pleural effusion Left sided pleural effussion, s/ left sided thoracentesis( drained 1 l of fluid) Current tobacco abuse  Plan Agree with unysn Will wait for cultures from thoracentesis Follow sputum culture. May need repeat thoracentesis to drain the entire pleural fluid if continues to have increasing pleural effusion. Will consider doing Bronch for recurrent PNA. Will need to follow up with Pulmonologist at the outpatient. Smoking cessation councelling    Bincy Varughese,AG-ACNP Pulmonary and [place]   10/06/2015, 6:08 PM  Pt. Seen and examined, agree with NP. Empyema, likely secondary to pneumonia which occurred one month ago. Decreased air entry in right lung.  Placed order for IR chest tube, will start instillation of TPA/Pulmozyme.    Marda Stalker, M.D.  10/07/15

## 2015-10-06 NOTE — Procedures (Signed)
[place] left thoracentesis   Complications:  None  Blood Loss: none  See dictation in canopy pacs

## 2015-10-06 NOTE — Plan of Care (Signed)
Problem: Education: Goal: Knowledge of [facility] Education information/materials will improve Outcome: Progressing Admitted 125  Came from specials  Via str post  US guide thoracentesis wit 1 liter  Fluid removed

## 2015-10-07 LAB — BASIC METABOLIC PANEL
Anion gap: 9 (ref 5–15)
BUN: 12 mg/dL (ref 6–20)
CALCIUM: 8.4 mg/dL — AB (ref 8.9–10.3)
CO2: 28 mmol/L (ref 22–32)
Chloride: 101 mmol/L (ref 101–111)
Creatinine, Ser: 0.77 mg/dL (ref 0.61–1.24)
GFR calc Af Amer: >60 mL/min (ref >60)
GLUCOSE: 128 mg/dL — AB (ref 65–99)
Potassium: 3.4 mmol/L — ABNORMAL LOW (ref 3.5–5.1)
SODIUM: 138 mmol/L (ref 135–145)

## 2015-10-07 LAB — CBC
HCT: 38 % — ABNORMAL LOW (ref 40.0–52.0)
Hemoglobin: 12.8 g/dL — ABNORMAL LOW (ref 13.0–18.0)
MCH: 30.3 pg (ref 26.0–34.0)
MCHC: 33.7 g/dL (ref 32.0–36.0)
MCV: 89.8 fL (ref 80.0–100.0)
PLATELETS: 451 10*3/uL — AB (ref 150–440)
RBC: 4.23 MIL/uL — ABNORMAL LOW (ref 4.40–5.90)
RDW: 13.6 % (ref 11.5–14.5)
WBC: 16.2 10*3/uL — AB (ref 3.8–10.6)

## 2015-10-07 LAB — PH, BODY FLUID: PH, BODY FLUID: 7.6

## 2015-10-07 LAB — CYTOLOGY - NON PAP

## 2015-10-07 LAB — ALBUMIN, FLUID (OTHER): ALBUMIN FL: 2.4 g/dL

## 2015-10-07 MED ORDER — ENOXAPARIN SODIUM 40 MG/0.4ML ~~LOC~~ SOLN
40.0000 mg | SUBCUTANEOUS | Status: DC
  Administered 2015-10-07 – 2015-10-12 (×6): 40 mg via SUBCUTANEOUS
  Filled 2015-10-07 (×6): qty 0.4

## 2015-10-07 MED ORDER — IPRATROPIUM-ALBUTEROL 0.5-2.5 (3) MG/3ML IN SOLN
3.0000 mL | Freq: Four times a day (QID) | RESPIRATORY_TRACT | Status: DC
  Administered 2015-10-07 – 2015-10-11 (×17): 3 mL via RESPIRATORY_TRACT
  Filled 2015-10-07 (×16): qty 3

## 2015-10-07 MED ORDER — IPRATROPIUM-ALBUTEROL 0.5-2.5 (3) MG/3ML IN SOLN: 3.0000 mL | RESPIRATORY_TRACT | Status: DC | PRN

## 2015-10-07 MED ORDER — SODIUM CHLORIDE 0.9 % IV SOLN
INTRAVENOUS | Status: DC
  Administered 2015-10-08 – 2015-10-10 (×2): via INTRAVENOUS

## 2015-10-07 MED ORDER — ENSURE ENLIVE PO LIQD
237.0000 mL | Freq: Two times a day (BID) | ORAL | Status: DC
  Administered 2015-10-07 – 2015-10-11 (×4): 237 mL via ORAL

## 2015-10-07 MED ORDER — MORPHINE SULFATE (PF) 2 MG/ML IV SOLN
2.0000 mg | Freq: Four times a day (QID) | INTRAVENOUS | Status: DC | PRN
  Administered 2015-10-07 – 2015-10-08 (×6): 2 mg via INTRAVENOUS
  Filled 2015-10-07 (×6): qty 1

## 2015-10-07 NOTE — Plan of Care (Signed)
Problem: Health Behavior/Discharge Planning: Goal: Ability to manage health-related needs will improve Outcome: Progressing Pt requiring  Frequent pain med.  Morphine  Alt with percocet

## 2015-10-07 NOTE — Progress Notes (Signed)
Initial Nutrition Assessment  DOCUMENTATION CODES:   Not applicable  INTERVENTION:  -Ensure Enlive po BID, each supplement provides 350 kcal and 20 grams of protein  NUTRITION DIAGNOSIS:   Inadequate oral intake related to poor appetite, other (see comment) (Pain) as evidenced by per patient/family report.  GOAL:   Patient will meet greater than or equal to 90% of their needs  MONITOR:   PO intake, I & O's, Labs, Weight trends  REASON FOR ASSESSMENT:   Malnutrition Screening Tool    ASSESSMENT:   Vincent Hoover  is a 50 y.o. male with a known history of Chronic arthritis, chronic back pain, history of melanoma, is a chronic smoker and is in first week of July went to [facility] for left-sided chest pain with breathing on CT scan he was found to have pneumonia with some pleural effusion and was given Levaquin orally and discharged home  Spoke with Vincent Hoover at bedside. For 4-5 days PTA he states he had so much pain he didn't eat anything at all. If he did eat, he said it would bloat him and cause severe gas. Appetite and Pain has improved, states pain is at a 4 while sitting in bed, but can easily shoot up to 10 when he walks around. This morning he had an omelet, toast, pineapple, and [place] sausage. Consumed 100% of everything besides sausage which he said he did not like. He exhibits moderate-severely sunken orbitals and moderate muscle wasting at temples, but is otherwise ok from an NFPE perspective. Per chart review, he also exhibits an 11#/6.9% insignificant wt loss over 1 month. Denies issues chewing or swallowing, though he does complain of a "bad tooth" that can get irritated.  Labs and Medications reviewed: K 3.4  Diet Order:  Diet Heart Room service appropriate? Yes; Fluid consistency: Thin  Skin:  Reviewed, no issues  Last BM:  10/01/2015  Height:   Ht Readings from Last 1 Encounters:  10/06/15 5\' 11"  (1.803 m)    Weight:   Wt Readings from Last 1  Encounters:  10/06/15 149 lb 6.4 oz (67.8 kg)    Ideal Body Weight:  78.18 kg  BMI:  Body mass index is 20.84 kg/m.  Estimated Nutritional Needs:   Kcal:  1600-2050 calories  Protein:  65-82 grams  Fluid:  >/= 1.6L  EDUCATION NEEDS:   No education needs identified at this time  Vincent Hoover. Vincent Brannock, MS, RD LDN Inpatient Clinical Dietitian Pager [phone]

## 2015-10-07 NOTE — Progress Notes (Signed)
[place] at [place] NAME: Vincent Hoover    MR#:  [id number]  DATE OF BIRTH:  06-Oct-1965  SUBJECTIVE:  CHIEF COMPLAINT:   Chief Complaint  Patient presents with  . Chest Pain  . Shortness of Breath   SOB better. Complains of pain back and neck  REVIEW OF SYSTEMS:    Review of Systems  Constitutional: Positive for malaise/fatigue. Negative for chills and fever.  HENT: Negative for sore throat.   Eyes: Negative for blurred vision, double vision and pain.  Respiratory: Positive for cough, sputum production and shortness of breath. Negative for hemoptysis and wheezing.   Cardiovascular: Negative for chest pain, palpitations, orthopnea and leg swelling.  Gastrointestinal: Negative for abdominal pain, constipation, diarrhea, heartburn, nausea and vomiting.  Genitourinary: Negative for dysuria and hematuria.  Musculoskeletal: Positive for back pain. Negative for joint pain.  Skin: Negative for rash.  Neurological: Positive for weakness. Negative for sensory change, speech change, focal weakness and headaches.  Endo/Heme/Allergies: Does not bruise/bleed easily.  Psychiatric/Behavioral: Negative for depression. The patient is not nervous/anxious.     DRUG ALLERGIES:   Allergies  Allergen Reactions  . Bee Venom Anaphylaxis    VITALS:  Blood pressure 130/77, pulse 94, temperature 98.4 F (36.9 C), temperature source Oral, resp. rate 20, height 5\' 11"  (1.803 m), weight 67.8 kg (149 lb 6.4 oz), SpO2 95 %.  PHYSICAL EXAMINATION:   Physical Exam  GENERAL:  50 y.o.-year-old patient lying in the bed with no acute distress.  EYES: Pupils equal, round, reactive to light and accommodation. No scleral icterus. Extraocular muscles intact.  HEENT: Head atraumatic, normocephalic. Oropharynx and nasopharynx clear.  NECK:  Supple, no jugular venous distention. No thyroid enlargement, no tenderness.  LUNGS: Decreased breath sound left base  with ronchi CARDIOVASCULAR: S1, S2 normal. No murmurs, rubs, or gallops.  ABDOMEN: Soft, nontender, nondistended. Bowel sounds present. No organomegaly or mass.  EXTREMITIES: No cyanosis, clubbing or edema b/l.    NEUROLOGIC: Cranial nerves II through XII are intact. No focal Motor or sensory deficits b/l.   PSYCHIATRIC: The patient is alert and oriented x 3.  SKIN: No obvious rash, lesion, or ulcer.   LABORATORY PANEL:   CBC  Recent Labs Lab 10/07/15 0406  WBC 16.2*  HGB 12.8*  HCT 38.0*  PLT 451*   ------------------------------------------------------------------------------------------------------------------ Chemistries   Recent Labs Lab 10/07/15 0406  NA 138  K 3.4*  CL 101  CO2 28  GLUCOSE 128*  BUN 12  CREATININE 0.77  CALCIUM 8.4*   ------------------------------------------------------------------------------------------------------------------  Cardiac Enzymes  Recent Labs Lab 10/06/15 0842  TROPONINI <0.03   ------------------------------------------------------------------------------------------------------------------  RADIOLOGY:  Dg Chest 1 View  Result Date: 10/06/2015 CLINICAL DATA:  Status post left thoracentesis EXAM: CHEST 1 VIEW COMPARISON:  Film from earlier in the same day FINDINGS: There has been significant decrease in the degree of left pleural effusion although a moderate effusion remains. Patient terminated the procedure prior to complete drainage. Additionally multiple loculations were noted during the drainage which will likely preclude complete evacuation. Patchy atelectatic changes are noted in the bases bilaterally similar to that seen on prior CT examination. Pneumothorax is noted. IMPRESSION: No pneumothorax following left thoracentesis. Significant decrease in pleural fluid is noted although a moderate effusion remains. Electronically Signed   By: Inez Catalina M.D.   On: 10/06/2015 14:27   Dg Chest 2 View  Result Date:  10/06/2015 CLINICAL DATA:  Left-sided chest pain and shortness of breath and  fever for 5 days. EXAM: CHEST  2 VIEW COMPARISON:  08/14/2015 FINDINGS: The cardiomediastinal silhouette is largely obscured by a large left pleural effusion and/or airspace consolidation, obscuring most of the left hemithorax. A rounded density overlying the anterior mediastinum on the lateral view likely represents loculated effusion. There is no evidence of pneumothorax. The right lung is clear. Osseous structures are without acute abnormality. Soft tissues are grossly normal. IMPRESSION: Interval development of large left pleural effusion with probable associated airspace consolidation, obscuring 2/3 of the left hemithorax. No evidence of pneumothorax. Electronically Signed   By: Fidela Salisbury M.D.   On: 10/06/2015 09:27   Ct Chest W Contrast  Result Date: 10/06/2015 CLINICAL DATA:  Shortness of breath and cough for 1 week. Pleural effusion on chest radiographs today. EXAM: CT CHEST WITH CONTRAST TECHNIQUE: Multidetector CT imaging of the chest was performed during intravenous contrast administration. CONTRAST:  75 cc Isovue 370. COMPARISON:  PA and lateral chest earlier today and 08/14/2015. FINDINGS: Cardiovascular: Heart size is normal. No pericardial effusion. No calcific atherosclerotic vascular disease is identified. Mediastinum/Nodes: A right hilar lymph node on image 70 measures 1.2 cm short axis dimension. Small mediastinal lymph nodes which are not pathologically enlarged by CT size criteria are seen. Lungs/Pleura: Large left pleural effusion appears at least partially loculated. No right pleural effusion is identified. Patchy airspace disease is seen in all lobes of the right lung. Extensive compressive atelectasis on the left is identified. Mucous is noted within the trachea. Centrilobular emphysematous change is seen. Upper Abdomen: Unremarkable. Musculoskeletal: No bony abnormality is identified. IMPRESSION:  Large left pleural effusion appears loculated. Patchy airspace disease in all lobes of the right lung most consistent with pneumonia. Emphysema. Small right hilar lymph node is likely reactive. Electronically Signed   By: Inge Rise M.D.   On: 10/06/2015 10:25   US Thoracentesis Asp Pleural Space W/img Guide  Result Date: 10/06/2015 INDICATION: Left-sided pleural effusion EXAM: ULTRASOUND GUIDED THORACENTESIS MEDICATIONS: None. ANESTHESIA/SEDATION: None COMPLICATIONS: None immediate. PROCEDURE: An ultrasound guided thoracentesis was thoroughly discussed with the patient and questions answered. The benefits, risks, alternatives and complications were also discussed. The patient understands and wishes to proceed with the procedure. Written consent was obtained. Ultrasound was performed to localize and mark an adequate pocket of fluid in the right chest. The area was then prepped and draped in the normal sterile fashion. 1% Lidocaine was used for local anesthesia. Under ultrasound guidance a 6 French thoracentesis catheter was introduced. Thoracentesis was performed. The catheter was removed and a dressing applied. FINDINGS: A total of approximately 1 L of clear yellow fluid was removed. A fluid sample was sent for laboratory analysis. Significant loculations were identified. Patient terminated the procedure prior to complete drainage of the fluid. IMPRESSION: Successful ultrasound guided left thoracentesis yielding 1 L of pleural fluid. Electronically Signed   By: Inez Catalina M.D.   On: 10/06/2015 14:25     ASSESSMENT AND PLAN:   * Acute hypoxic respiratory failure with sepsis secondary to recurrent pneumonia and left Empyema On broad-spectrum IV antibiotics Discussed with Dr. Juanell Fairly of pulmonary. Chest tube to be placed by IR Still has significant fluid post-thoracentesis  * Left-sided chest pain   He has a negative treadmill stress test done at [facility] on 27th of July, 2017.   Pain is secondary to  his pleural effusion, give Percocet for symptomatic control.  * Active smoking   Counseled to quit smoking on admission  All the records are reviewed and  case discussed with Care Management/Social Workerr. Management plans discussed with the patient, family and they are in agreement.  CODE STATUS: FULL CODE  DVT Prophylaxis: SCDs  TOTAL TIME TAKING CARE OF THIS PATIENT: 30 minutes.   POSSIBLE D/C IN 2-3 DAYS, DEPENDING ON CLINICAL CONDITION.  Hillary Bow R M.D on 10/07/2015 at 2:02 PM  Between 7am to 6pm - Pager - [phone]  After 6pm go to [email or web address] - password [email or web address] [facility]  [facility]  Office  [phone]  CC: Primary care physician; No PCP Per Patient  Note: This dictation was prepared with Dragon dictation along with smaller phrase technology. Any transcriptional errors that result from this process are unintentional.

## 2015-10-08 ENCOUNTER — Inpatient Hospital Stay: Payer: Medicaid Other

## 2015-10-08 MED ORDER — STERILE WATER FOR INJECTION IJ SOLN
5.0000 mg | Freq: Two times a day (BID) | INTRAMUSCULAR | Status: DC
  Administered 2015-10-08 – 2015-10-10 (×4): 5 mg via INTRAPLEURAL
  Filled 2015-10-08 (×7): qty 5

## 2015-10-08 MED ORDER — BUPIVACAINE HCL (PF) 0.25 % IJ SOLN
20.0000 mL | Freq: Once | INTRAMUSCULAR | Status: DC
  Filled 2015-10-08: qty 30

## 2015-10-08 MED ORDER — MORPHINE SULFATE (PF) 2 MG/ML IV SOLN
2.0000 mg | INTRAVENOUS | Status: AC
  Administered 2015-10-08: 2 mg via INTRAVENOUS
  Filled 2015-10-08: qty 1

## 2015-10-08 MED ORDER — MORPHINE SULFATE (PF) 2 MG/ML IV SOLN
2.0000 mg | INTRAVENOUS | Status: AC
  Administered 2015-10-08: 18:00:00 2 mg via INTRAVENOUS
  Filled 2015-10-08: qty 1

## 2015-10-08 MED ORDER — ROPIVACAINE HCL 5 MG/ML IJ SOLN
INTRAMUSCULAR | Status: AC
  Filled 2015-10-08: qty 20

## 2015-10-08 MED ORDER — FENTANYL CITRATE (PF) 100 MCG/2ML IJ SOLN
INTRAMUSCULAR | Status: AC
  Filled 2015-10-08: qty 2

## 2015-10-08 MED ORDER — MIDAZOLAM HCL 5 MG/5ML IJ SOLN
INTRAMUSCULAR | Status: AC
  Filled 2015-10-08: qty 5

## 2015-10-08 MED ORDER — ALTEPLASE 2 MG IJ SOLR
10.0000 mg | Freq: Two times a day (BID) | INTRAMUSCULAR | Status: DC
  Administered 2015-10-08 – 2015-10-10 (×4): 10 mg via INTRAPLEURAL
  Filled 2015-10-08 (×7): qty 10

## 2015-10-08 MED ORDER — MORPHINE SULFATE (PF) 4 MG/ML IV SOLN
4.0000 mg | Freq: Four times a day (QID) | INTRAVENOUS | Status: DC | PRN
  Administered 2015-10-09 – 2015-10-12 (×10): 4 mg via INTRAVENOUS
  Filled 2015-10-08 (×10): qty 1

## 2015-10-08 MED ORDER — BUPIVACAINE HCL 0.25 % IJ SOLN
20.0000 mL | Freq: Once | INTRAMUSCULAR | Status: DC
  Filled 2015-10-08: qty 20

## 2015-10-08 NOTE — Progress Notes (Signed)
Notified Dr. Rosita Fire that patient is having increased pain, noteing that it is the "worse" pain, blood pressure is stable but tachy at 112, low grade fever and appears to be in distress with no improvement following morphine. Per MD place order for stat chest x ray and given additional 2 mg morphine IV. Verbal, Telephone.

## 2015-10-08 NOTE — Progress Notes (Signed)
* [facility]     Assessment and Plan:  Empyema. --s/p chest tube placement in left pleural space by IR.  --will start instillation of TPA/pulmozyme combination today.   Pleuritic Chest pain.  --Secondary to above, continue currently management, pain meds as needed.   Pneumonia.  --LLL pneumonia, continue abx.   Nicotine Abuse.  --Likely contributory, discussed smoking cessation, 69min.   Addendum: --Instilled 10 mg TPA and 5 mg pulmozyme instilled via chest tube, tube to be clamped for 1 hour.   Date: 10/08/2015  MRN# [id number] Vincent Hoover 1965/11/26   Vincent Holster. is a 50 y.o. old male seen in follow up for chief complaint of  Chief Complaint  Patient presents with  . Chest Pain  . Shortness of Breath     HPI:  Pt had chest tube placed today, continues to have left pleuritic chest pain, slightly worse after placement of tube.    Medication:   Reviewed.   Allergies:  Bee venom  Review of Systems: Gen:  Denies  fever, sweats. HEENT: Denies blurred vision. Cvc:  No dizziness, chest pain or heaviness Resp:   Denies cough or sputum porduction. Gi: Denies swallowing difficulty, stomach pain.  Gu:  Denies bladder incontinence, burning urine Ext:   No Joint pain, stiffness. Skin: No skin rash, easy bruising. Endoc:  No polyuria, polydipsia. Psych: No depression, insomnia. Other:  All other systems were reviewed and found to be negative other than what is mentioned in the HPI.   Physical Examination:   VS: BP (!) 134/92   Pulse 94   Temp 99.2 F (37.3 C) (Oral)   Resp 20   Ht 5\' 11"  (1.803 m)   Wt 149 lb 6.4 oz (67.8 kg)   SpO2 97%   BMI 20.84 kg/m   General Appearance: No distress  Neuro:without focal findings,  speech normal,  HEENT: PERRLA, EOM intact. Pulmonary: normal breath sounds, decreased air entry in left lung.   CardiovascularNormal S1,S2.  No m/r/g.   Abdomen: Benign, Soft, non-tender. Renal:  No  costovertebral tenderness  GU:  Not performed at this time. Endoc: No evident thyromegaly, no signs of acromegaly. Skin:   warm, no rash. Extremities: normal, no cyanosis, clubbing.   LABORATORY PANEL:   CBC  Recent Labs Lab 10/07/15 0406  WBC 16.2*  HGB 12.8*  HCT 38.0*  PLT 451*   ------------------------------------------------------------------------------------------------------------------  Chemistries   Recent Labs Lab 10/07/15 0406  NA 138  K 3.4*  CL 101  CO2 28  GLUCOSE 128*  BUN 12  CREATININE 0.77  CALCIUM 8.4*   ------------------------------------------------------------------------------------------------------------------  Cardiac Enzymes  Recent Labs Lab 10/06/15 0842  TROPONINI <0.03   ------------------------------------------------------------  RADIOLOGY:   No results found for this or any previous visit. Results for orders placed during the hospital encounter of 10/06/15  DG Chest 2 View   Narrative CLINICAL DATA:  Left-sided chest pain and shortness of breath and fever for 5 days.  EXAM: CHEST  2 VIEW  COMPARISON:  08/14/2015  FINDINGS: The cardiomediastinal silhouette is largely obscured by a large left pleural effusion and/or airspace consolidation, obscuring most of the left hemithorax. A rounded density overlying the anterior mediastinum on the lateral view likely represents loculated effusion. There is no evidence of pneumothorax. The right lung is clear.  Osseous structures are without acute abnormality. Soft tissues are grossly normal.  IMPRESSION: Interval development of large left pleural effusion with probable associated airspace consolidation, obscuring 2/3 of the  left hemithorax.  No evidence of pneumothorax.   Electronically Signed   By: Fidela Salisbury M.D.   On: 10/06/2015 09:27     ------------------------------------------------------------------------------------------------------------------  Thank  you for allowing [facility] to assist in the care of your patient. Our recommendations are noted above.  Please contact us if we can be of further service.   Marda Stalker, MD.  [facility] Office Number: [phone]  Patricia Pesa, M.D.  Vilinda Boehringer, M.D.  Merton Border, M.D  10/08/2015

## 2015-10-08 NOTE — Care Management (Signed)
Admitted to [facility] with the diagnosis of pleural effusion. Lives with his 4 children in [place] . Oldest child is Rodman Key (age 48) [phone]). States he use to live in [place] but moved to [place]. Moved to [place] from [place] in January 2017. Works at [employer] in [employer] x 1 month. States he had Medicaid in [place] and [employer]. No primary care. "I don't go to the doctor, usually not sick."  Takes care of all basic and instrumental activities of daily living himself, drives. [facility]acy [facility].  Chest tube placed today.  Will need follow-up physician. Shelbie Ammons RN MSN CCM Care management [phone]

## 2015-10-08 NOTE — Progress Notes (Addendum)
Spoke with Dr. Juanell Fairly via telephone,  Notified MD that patient is c/o of increase and pian and that chest tube is clamped, per MD chest tube should be unclamped but that pain is likely from medication administered through chest tube.

## 2015-10-08 NOTE — Progress Notes (Signed)
Pt is alert and oriented x 4, left chest tube placed in IR, pain treated with morphine and oxycodone for  Chest pain, fair appetite, bed bound secondary to chest tube placement, patient seen at bedside by Dr. Juanell Fairly, low grade temp., vital signs stable, using urinal at bedside.

## 2015-10-08 NOTE — Procedures (Signed)
26 F CT guided chest tube placement for thrombolytics   Complications:  None  Blood Loss: none  See dictation in canopy pacs

## 2015-10-08 NOTE — Progress Notes (Signed)
[place] at [place] NAME: Vincent Hoover    MR#:  [id number]  DATE OF BIRTH:  [birth year]  SUBJECTIVE:  CHIEF COMPLAINT:   Chief Complaint  Patient presents with  . Chest Pain  . Shortness of Breath   SOB better. Some cough. Left-sided chest pain at the chest tube site. Afebrile.  REVIEW OF SYSTEMS:    Review of Systems  Constitutional: Positive for malaise/fatigue. Negative for chills and fever.  HENT: Negative for sore throat.   Eyes: Negative for blurred vision, double vision and pain.  Respiratory: Positive for cough, sputum production and shortness of breath. Negative for hemoptysis and wheezing.   Cardiovascular: Negative for chest pain, palpitations, orthopnea and leg swelling.  Gastrointestinal: Negative for abdominal pain, constipation, diarrhea, heartburn, nausea and vomiting.  Genitourinary: Negative for dysuria and hematuria.  Musculoskeletal: Positive for back pain. Negative for joint pain.  Skin: Negative for rash.  Neurological: Positive for weakness. Negative for sensory change, speech change, focal weakness and headaches.  Endo/Heme/Allergies: Does not bruise/bleed easily.  Psychiatric/Behavioral: Negative for depression. The patient is not nervous/anxious.     DRUG ALLERGIES:   Allergies  Allergen Reactions  . Bee Venom Anaphylaxis    VITALS:  Blood pressure (!) 134/92, pulse 94, temperature 99.2 F (37.3 C), temperature source Oral, resp. rate 20, height 5\' 11"  (1.803 m), weight 67.8 kg (149 lb 6.4 oz), SpO2 97 %.  PHYSICAL EXAMINATION:   Physical Exam  GENERAL:  50 y.o.-year-old patient lying in the bed with no acute distress.  EYES: Pupils equal, round, reactive to light and accommodation. No scleral icterus. Extraocular muscles intact.  HEENT: Head atraumatic, normocephalic. Oropharynx and nasopharynx clear.  NECK:  Supple, no jugular venous distention. No thyroid enlargement, no tenderness.   LUNGS: Decreased breath sound left base with ronchi. Left chest tube in place CARDIOVASCULAR: S1, S2 normal. No murmurs, rubs, or gallops.  ABDOMEN: Soft, nontender, nondistended. Bowel sounds present. No organomegaly or mass.  EXTREMITIES: No cyanosis, clubbing or edema b/l.    NEUROLOGIC: Cranial nerves II through XII are intact. No focal Motor or sensory deficits b/l.   PSYCHIATRIC: The patient is alert and oriented x 3.  SKIN: No obvious rash, lesion, or ulcer.   LABORATORY PANEL:   CBC  Recent Labs Lab 10/07/15 0406  WBC 16.2*  HGB 12.8*  HCT 38.0*  PLT 451*   ------------------------------------------------------------------------------------------------------------------ Chemistries   Recent Labs Lab 10/07/15 0406  NA 138  K 3.4*  CL 101  CO2 28  GLUCOSE 128*  BUN 12  CREATININE 0.77  CALCIUM 8.4*   ------------------------------------------------------------------------------------------------------------------  Cardiac Enzymes  Recent Labs Lab 10/06/15 0842  TROPONINI <0.03   ------------------------------------------------------------------------------------------------------------------  RADIOLOGY:  Dg Chest 1 View  Result Date: 10/06/2015 CLINICAL DATA:  Status post left thoracentesis EXAM: CHEST 1 VIEW COMPARISON:  Film from earlier in the same day FINDINGS: There has been significant decrease in the degree of left pleural effusion although a moderate effusion remains. Patient terminated the procedure prior to complete drainage. Additionally multiple loculations were noted during the drainage which will likely preclude complete evacuation. Patchy atelectatic changes are noted in the bases bilaterally similar to that seen on prior CT examination. Pneumothorax is noted. IMPRESSION: No pneumothorax following left thoracentesis. Significant decrease in pleural fluid is noted although a moderate effusion remains. Electronically Signed   By: Inez Catalina M.D.    On: 10/06/2015 14:27   Ct Image Guided Drainage By Percutaneous Catheter  Result Date: 10/08/2015 INDICATION: Persistent loculated left pleural effusion EXAM: CT GUIDED PLACEMENT OF LEFT CHEST TUBE FOR THROMBOLYTIC THERAPY MEDICATIONS: The patient is currently admitted to the hospital and receiving intravenous antibiotics. The antibiotics were administered within an appropriate time frame prior to the initiation of the procedure. ANESTHESIA/SEDATION: None COMPLICATIONS: None immediate. TECHNIQUE: Informed written consent was obtained from the patient after a thorough discussion of the procedural risks, benefits and alternatives. All questions were addressed. Maximal Sterile Barrier Technique was utilized including caps, mask, sterile gowns, sterile gloves, sterile drape, hand hygiene and skin antiseptic. A timeout was performed prior to the initiation of the procedure. PROCEDURE: The left lateral chest wall was prepped with chlorhexidine in a sterile fashion, and a sterile drape was applied covering the operative field. A sterile gown and sterile gloves were used for the procedure. Local anesthesia was provided with 0.25% Marcaine. A Yueh catheter needle combination was utilized to access the left pleural effusion laterally. A short stiff Amplatz wire was then coiled within the pleural collection. Sequence with dilatation from 8-16 [place] was then performed without difficulty. A 16 French drainage catheter was then passed into the left pleural effusion posteriorly and sutured into place utilizing 0-Prolene. The catheter was then dressed in the standard manner. The patient tolerated the procedure well and was returned his room in satisfactory condition. FINDINGS: Persistent left pleural effusion is noted. Improved aeration when compare with the prior CT is noted. IMPRESSION: Successful placement of a 50 French chest tube on the left for subsequent thrombolytic therapy. Electronically Signed   By: Inez Catalina  M.D.   On: 10/08/2015 11:16   US Thoracentesis Asp Pleural Space W/img Guide  Result Date: 10/06/2015 INDICATION: Left-sided pleural effusion EXAM: ULTRASOUND GUIDED THORACENTESIS MEDICATIONS: None. ANESTHESIA/SEDATION: None COMPLICATIONS: None immediate. PROCEDURE: An ultrasound guided thoracentesis was thoroughly discussed with the patient and questions answered. The benefits, risks, alternatives and complications were also discussed. The patient understands and wishes to proceed with the procedure. Written consent was obtained. Ultrasound was performed to localize and mark an adequate pocket of fluid in the right chest. The area was then prepped and draped in the normal sterile fashion. 1% Lidocaine was used for local anesthesia. Under ultrasound guidance a 6 French thoracentesis catheter was introduced. Thoracentesis was performed. The catheter was removed and a dressing applied. FINDINGS: A total of approximately 1 L of clear yellow fluid was removed. A fluid sample was sent for laboratory analysis. Significant loculations were identified. Patient terminated the procedure prior to complete drainage of the fluid. IMPRESSION: Successful ultrasound guided left thoracentesis yielding 1 L of pleural fluid. Electronically Signed   By: Inez Catalina M.D.   On: 10/06/2015 14:25     ASSESSMENT AND PLAN:   * Acute hypoxic respiratory failure with sepsis secondary to recurrent pneumonia and left Empyema On broad-spectrum IV antibiotics Discussed with Dr. Juanell Fairly of pulmonary. Chest tube placed by IR. Will likely need TPA through the chest tube. Repeat chest x-ray in a.m.  * Left-sided chest pain   He has a negative treadmill stress test done at [facility] on 27th of July, 2017.   Pain is secondary to his pleural effusion, give Percocet for symptomatic control.  * Active smoking   Counseled to quit smoking on admission  All the records are reviewed and case discussed with Care Management/Social  Workerr. Management plans discussed with the patient, family and they are in agreement.  CODE STATUS: FULL CODE  DVT Prophylaxis: SCDs  TOTAL TIME TAKING  CARE OF THIS PATIENT: 30 minutes.   POSSIBLE D/C IN 2-3 DAYS, DEPENDING ON CLINICAL CONDITION.  Hillary Bow R M.D on 10/08/2015 at 11:34 AM  Between 7am to 6pm - Pager - [phone]  After 6pm go to [email or web address] - password [email or web address] [facility]  [facility]  Office  [phone]  CC: Primary care physician; No PCP Per Patient  Note: This dictation was prepared with Dragon dictation along with smaller phrase technology. Any transcriptional errors that result from this process are unintentional.

## 2015-10-09 ENCOUNTER — Inpatient Hospital Stay: Payer: Medicaid Other

## 2015-10-09 MED ORDER — BISACODYL 5 MG PO TBEC
10.0000 mg | DELAYED_RELEASE_TABLET | Freq: Every day | ORAL | Status: AC
  Administered 2015-10-09 – 2015-10-10 (×2): 10 mg via ORAL
  Filled 2015-10-09 (×2): qty 2

## 2015-10-09 MED ORDER — POLYETHYLENE GLYCOL 3350 17 G PO PACK: 17.0000 g | PACK | Freq: Every day | ORAL | Status: DC | PRN

## 2015-10-09 MED ORDER — KETOROLAC TROMETHAMINE 30 MG/ML IJ SOLN
30.0000 mg | Freq: Three times a day (TID) | INTRAMUSCULAR | Status: DC | PRN
  Administered 2015-10-09 – 2015-10-12 (×10): 30 mg via INTRAVENOUS
  Filled 2015-10-09 (×10): qty 1

## 2015-10-09 MED ORDER — SENNOSIDES-DOCUSATE SODIUM 8.6-50 MG PO TABS
2.0000 | ORAL_TABLET | Freq: Two times a day (BID) | ORAL | Status: DC
  Administered 2015-10-09 – 2015-10-12 (×7): 2 via ORAL
  Filled 2015-10-09 (×7): qty 2

## 2015-10-09 NOTE — Progress Notes (Signed)
* [facility]     Assessment and Plan:  Empyema. --s/p chest tube placement in left pleural space by IR.  --Status post instillation of TPA/pulmozyme combination yesterday, will repeat today.  -Hemorrhagic pleural effusion present after its dilation of TPA  Pleuritic Chest pain.  --Secondary to above, continue currently management, pain meds as needed.  -Added Toradol for pain today.  Pneumonia.  --LLL pneumonia, continue abx.   Nicotine Abuse.  --Likely contributory, discussed smoking cessation  Addendum: --On 8/24: Instilled 10 mg TPA and 5 mg pulmozyme instilled via chest tube, tube to be clamped for 1 hour.   Date: 10/09/2015  MRN# [id number] Vincent Hoover 12-Jan-1966   Vincent Hoover. is a 50 y.o. old male seen in follow up for chief complaint of  Chief Complaint  Patient presents with  . Chest Pain  . Shortness of Breath     HPI:  Worse left pleuritic chest pain.  Medication:   Reviewed.   Allergies:  Bee venom  Review of Systems: Gen:  Denies  fever, sweats. HEENT: Denies blurred vision. Cvc:  No dizziness, chest pain or heaviness Resp:   Denies cough or sputum porduction. Gi: Denies swallowing difficulty, stomach pain.  Gu:  Denies bladder incontinence, burning urine Ext:   No Joint pain, stiffness. Skin: No skin rash, easy bruising. Endoc:  No polyuria, polydipsia. Psych: No depression, insomnia. Other:  All other systems were reviewed and found to be negative other than what is mentioned in the HPI.   Physical Examination:   VS: BP 127/77 (BP Location: Left Arm)   Pulse 96   Temp 98 F (36.7 C) (Oral)   Resp 20   Ht 5\' 11"  (1.803 m)   Wt 149 lb 6.4 oz (67.8 kg)   SpO2 97%   BMI 20.84 kg/m   General Appearance: No distress  Neuro:without focal findings,  speech normal,  HEENT: PERRLA, EOM intact. Pulmonary: normal breath sounds, decreased air entry in left lung.   CardiovascularNormal S1,S2.  No m/r/g.     Abdomen: Benign, Soft, non-tender. Renal:  No costovertebral tenderness  GU:  Not performed at this time. Endoc: No evident thyromegaly, no signs of acromegaly. Skin:   warm, no rash. Extremities: normal, no cyanosis, clubbing.   LABORATORY PANEL:   CBC  Recent Labs Lab 10/07/15 0406  WBC 16.2*  HGB 12.8*  HCT 38.0*  PLT 451*   ------------------------------------------------------------------------------------------------------------------  Chemistries   Recent Labs Lab 10/07/15 0406  NA 138  K 3.4*  CL 101  CO2 28  GLUCOSE 128*  BUN 12  CREATININE 0.77  CALCIUM 8.4*   ------------------------------------------------------------------------------------------------------------------  Cardiac Enzymes  Recent Labs Lab 10/06/15 0842  TROPONINI <0.03   ------------------------------------------------------------  RADIOLOGY:   No results found for this or any previous visit. Results for orders placed during the hospital encounter of 10/06/15  DG Chest 2 View   Narrative CLINICAL DATA:  Left-sided chest pain and shortness of breath and fever for 5 days.  EXAM: CHEST  2 VIEW  COMPARISON:  08/14/2015  FINDINGS: The cardiomediastinal silhouette is largely obscured by a large left pleural effusion and/or airspace consolidation, obscuring most of the left hemithorax. A rounded density overlying the anterior mediastinum on the lateral view likely represents loculated effusion. There is no evidence of pneumothorax. The right lung is clear.  Osseous structures are without acute abnormality. Soft tissues are grossly normal.  IMPRESSION: Interval development of large left pleural effusion with probable associated airspace  consolidation, obscuring 2/3 of the left hemithorax.  No evidence of pneumothorax.   Electronically Signed   By: Fidela Salisbury M.D.   On: 10/06/2015 09:27     ------------------------------------------------------------------------------------------------------------------  Thank  you for allowing [facility] to assist in the care of your patient. Our recommendations are noted above.  Please contact us if we can be of further service.   Marda Stalker, MD.  [facility] Office Number: [phone]  Patricia Pesa, M.D.  Vilinda Boehringer, M.D.  Merton Border, M.D  10/09/2015

## 2015-10-09 NOTE — Progress Notes (Signed)
[facility] [place] NAME: Colvin Rayhill    MR#:  [id number]  DATE OF BIRTH:  12-19-1965  SUBJECTIVE:  CHIEF COMPLAINT:   Chief Complaint  Patient presents with  . Chest Pain  . Shortness of Breath   SOB better. Some cough. Left-sided chest pain at the chest tube site. Afebrile. Constipated  REVIEW OF SYSTEMS:    Review of Systems  Constitutional: Positive for malaise/fatigue. Negative for chills and fever.  HENT: Negative for sore throat.   Eyes: Negative for blurred vision, double vision and pain.  Respiratory: Positive for cough, sputum production and shortness of breath. Negative for hemoptysis and wheezing.   Cardiovascular: Negative for chest pain, palpitations, orthopnea and leg swelling.  Gastrointestinal: Negative for abdominal pain, constipation, diarrhea, heartburn, nausea and vomiting.  Genitourinary: Negative for dysuria and hematuria.  Musculoskeletal: Positive for back pain. Negative for joint pain.  Skin: Negative for rash.  Neurological: Positive for weakness. Negative for sensory change, speech change, focal weakness and headaches.  Endo/Heme/Allergies: Does not bruise/bleed easily.  Psychiatric/Behavioral: Negative for depression. The patient is not nervous/anxious.     DRUG ALLERGIES:   Allergies  Allergen Reactions  . Bee Venom Anaphylaxis    VITALS:  Blood pressure 127/77, pulse 96, temperature 98 F (36.7 C), temperature source Oral, resp. rate 20, height 5\' 11"  (1.803 m), weight 67.8 kg (149 lb 6.4 oz), SpO2 97 %.  PHYSICAL EXAMINATION:   Physical Exam  GENERAL:  50 y.o.-year-old patient lying in the bed with no acute distress.  EYES: Pupils equal, round, reactive to light and accommodation. No scleral icterus. Extraocular muscles intact.  HEENT: Head atraumatic, normocephalic. Oropharynx and nasopharynx clear.  NECK:  Supple, no jugular venous distention. No thyroid enlargement, no  tenderness.  LUNGS: Decreased breath sound left base with ronchi. Left chest tube in place CARDIOVASCULAR: S1, S2 normal. No murmurs, rubs, or gallops.  ABDOMEN: Soft, nontender, nondistended. Bowel sounds present. No organomegaly or mass.  EXTREMITIES: No cyanosis, clubbing or edema b/l.    NEUROLOGIC: Cranial nerves II through XII are intact. No focal Motor or sensory deficits b/l.   PSYCHIATRIC: The patient is alert and oriented x 3.  SKIN: No obvious rash, lesion, or ulcer.   LABORATORY PANEL:   CBC  Recent Labs Lab 10/07/15 0406  WBC 16.2*  HGB 12.8*  HCT 38.0*  PLT 451*   ------------------------------------------------------------------------------------------------------------------ Chemistries   Recent Labs Lab 10/07/15 0406  NA 138  K 3.4*  CL 101  CO2 28  GLUCOSE 128*  BUN 12  CREATININE 0.77  CALCIUM 8.4*   ------------------------------------------------------------------------------------------------------------------  Cardiac Enzymes  Recent Labs Lab 10/06/15 0842  TROPONINI <0.03   ------------------------------------------------------------------------------------------------------------------  RADIOLOGY:  Dg Chest 1 View  Result Date: 10/09/2015 CLINICAL DATA:  Recurrent pleural effusion EXAM: CHEST 1 VIEW COMPARISON:  Portable chest x-ray of October 08, 2015 FINDINGS: There is a small left pleural effusion which has decreased in volume since yesterday's study. The small caliber chest tube is in stable position at the base of the left pleural space. There is persistent atelectasis or infiltrate at the left base. On the right there is patchy interstitial density in the lower lobe which is stable. The upper lobes are relatively clear. The heart and pulmonary vascularity are unremarkable. The mediastinum is normal in width. IMPRESSION: Small left basilar pleural effusion decreased in volume since yesterday's study. There is no pneumothorax. The chest  tube is stable in position. Stable interstitial density  at both lung bases. Electronically Signed   By: David  Martinique M.D.   On: 10/09/2015 07:03   Dg Chest Port 1 View  Result Date: 10/08/2015 CLINICAL DATA:  Shortness of breath and chest pain. Status post insertion of left chest thoracostomy tube earlier today. EXAM: PORTABLE CHEST 1 VIEW COMPARISON:  Imaging during chest tube placement procedure earlier today as well as prior chest x-ray on 10/06/2015. FINDINGS: Left basilar pigtail drainage catheter present. There is likely some reduction in pleural fluid since the previous chest x-ray with some residual loculated fluid remaining. No pneumothorax. Underlying consolidation/infiltrate noted in the left lower lung. Right lung base shows stable atelectasis/infiltrate. IMPRESSION: Interval placement of left basilar pigtail pleural drainage catheter. No pneumothorax present. Likely some decrease in left pleural fluid with some residual loculated fluid remaining as well as persistent bilateral lower lobe airspace disease, left greater than right. Electronically Signed   By: Aletta Edouard M.D.   On: 10/08/2015 18:01   Ct Image Guided Drainage By Percutaneous Catheter  Result Date: 10/08/2015 INDICATION: Persistent loculated left pleural effusion EXAM: CT GUIDED PLACEMENT OF LEFT CHEST TUBE FOR THROMBOLYTIC THERAPY MEDICATIONS: The patient is currently admitted to the hospital and receiving intravenous antibiotics. The antibiotics were administered within an appropriate time frame prior to the initiation of the procedure. ANESTHESIA/SEDATION: None COMPLICATIONS: None immediate. TECHNIQUE: Informed written consent was obtained from the patient after a thorough discussion of the procedural risks, benefits and alternatives. All questions were addressed. Maximal Sterile Barrier Technique was utilized including caps, mask, sterile gowns, sterile gloves, sterile drape, hand hygiene and skin antiseptic. A timeout was  performed prior to the initiation of the procedure. PROCEDURE: The left lateral chest wall was prepped with chlorhexidine in a sterile fashion, and a sterile drape was applied covering the operative field. A sterile gown and sterile gloves were used for the procedure. Local anesthesia was provided with 0.25% Marcaine. A Yueh catheter needle combination was utilized to access the left pleural effusion laterally. A short stiff Amplatz wire was then coiled within the pleural collection. Sequence with dilatation from 8-16 [place] was then performed without difficulty. A 16 French drainage catheter was then passed into the left pleural effusion posteriorly and sutured into place utilizing 0-Prolene. The catheter was then dressed in the standard manner. The patient tolerated the procedure well and was returned his room in satisfactory condition. FINDINGS: Persistent left pleural effusion is noted. Improved aeration when compare with the prior CT is noted. IMPRESSION: Successful placement of a 65 French chest tube on the left for subsequent thrombolytic therapy. Electronically Signed   By: Inez Catalina M.D.   On: 10/08/2015 11:16     ASSESSMENT AND PLAN:   * Acute hypoxic respiratory failure with sepsis secondary to recurrent pneumonia and left Empyema with loculated effusion Thoracentesis with incomplete evacuation. IR placed Chest tube. TPA and pulmozyme injected yesterday and today by Dr. Juanell Fairly. On broad-spectrum IV antibiotics.  * Left-sided chest pain   He had a negative treadmill stress test done at [facility] on 27th of July, 2017.   Pain is secondary to his pleural effusion. On percocet and morphine. Toradol added today.  * Active smoking   Counseled to quit smoking on admission  All the records are reviewed and case discussed with Care Management/Social Workerr. Management plans discussed with the patient, family and they are in agreement.  CODE STATUS: FULL CODE  DVT Prophylaxis: SCDs  TOTAL  TIME TAKING CARE OF THIS PATIENT: 30 minutes.  POSSIBLE D/C IN 2-3 DAYS, DEPENDING ON CLINICAL CONDITION.  Hillary Bow R M.D on 10/09/2015 at 1:32 PM  Between 7am to 6pm - Pager - [phone]  After 6pm go to [email or web address] - password [email or web address] [facility]  [facility]  Office  [phone]  CC: Primary care physician; No PCP Per Patient  Note: This dictation was prepared with Dragon dictation along with smaller phrase technology. Any transcriptional errors that result from this process are unintentional.

## 2015-10-10 ENCOUNTER — Inpatient Hospital Stay: Payer: Medicaid Other

## 2015-10-10 LAB — CBC WITH DIFFERENTIAL/PLATELET
BASOS ABS: 0.1 10*3/uL (ref 0–0.1)
Basophils Relative: 1 %
EOS PCT: 8 %
Eosinophils Absolute: 1 10*3/uL — ABNORMAL HIGH (ref 0–0.7)
HEMATOCRIT: 36.4 % — AB (ref 40.0–52.0)
Hemoglobin: 12.4 g/dL — ABNORMAL LOW (ref 13.0–18.0)
LYMPHS PCT: 19 %
Lymphs Abs: 2.6 10*3/uL (ref 1.0–3.6)
MCH: 30.4 pg (ref 26.0–34.0)
MCHC: 34.2 g/dL (ref 32.0–36.0)
MCV: 89.1 fL (ref 80.0–100.0)
MONO ABS: 1.3 10*3/uL — AB (ref 0.2–1.0)
MONOS PCT: 9 %
NEUTROS ABS: 8.6 10*3/uL — AB (ref 1.4–6.5)
Neutrophils Relative %: 63 %
PLATELETS: 572 10*3/uL — AB (ref 150–440)
RBC: 4.08 MIL/uL — ABNORMAL LOW (ref 4.40–5.90)
RDW: 13.6 % (ref 11.5–14.5)
WBC: 13.6 10*3/uL — ABNORMAL HIGH (ref 3.8–10.6)

## 2015-10-10 LAB — BODY FLUID CULTURE: Culture: NO GROWTH

## 2015-10-10 MED ORDER — DORNASE ALFA 2.5 MG/2.5ML IN SOLN
5.0000 mg | Freq: Two times a day (BID) | RESPIRATORY_TRACT | Status: AC
  Administered 2015-10-10 – 2015-10-11 (×3): 5 mg via INTRAPLEURAL
  Filled 2015-10-10 (×5): qty 5

## 2015-10-10 MED ORDER — SODIUM CHLORIDE 0.9 % IJ SOLN
10.0000 mg | Freq: Two times a day (BID) | INTRAMUSCULAR | Status: AC
  Administered 2015-10-10 – 2015-10-11 (×3): 10 mg via INTRAPLEURAL
  Filled 2015-10-10 (×5): qty 10

## 2015-10-10 NOTE — Progress Notes (Signed)
Patient is alert and oriented x 4, walked 2 laps around unit, bm during shift, good appetite, frequent pain controlled with prn medications, seen by pulmonology, chest tube chamber changed, sanguineous output from chest tube, on room air, denies respiratory distress, uneventful shift, anticipate d/c for early next week.

## 2015-10-10 NOTE — Progress Notes (Signed)
Pharmacy Antibiotic Note  Vincent Hoover. is a 50 y.o. male admitted on 10/06/2015 with pneumonia.  Pharmacy has been consulted for Unasyn dosing.  Plan: Will continue  Unasyn 3g IV every 6 hours    Height: 5\' 11"  (180.3 cm) Weight: 149 lb 6.4 oz (67.8 kg) IBW/kg (Calculated) : 75.3  Temp (24hrs), Avg:98.4 F (36.9 C), Min:97.8 F (36.6 C), Max:98.7 F (37.1 C)   Recent Labs Lab 10/06/15 0842 10/06/15 0956 10/07/15 0406 10/10/15 0338  WBC 21.0*  --  16.2* 13.6*  CREATININE 0.77  --  0.77  --   LATICACIDVEN  --  0.8  --   --     Estimated Creatinine Clearance: 108.3 mL/min (by C-G formula based on SCr of 0.8 mg/dL).    Allergies  Allergen Reactions  . Bee Venom Anaphylaxis    Antimicrobials this admission: Vancomycin 8/21 >> 8/21 Zosyn 8/21 >> 8/21 Unasyn 8/21 >>   Dose adjustments this admission: No dose adjustments needed at this time.   Microbiology results: 8/21 BCx: in process 8/21 Sputum: needs to be collected  Thank you for allowing pharmacy to be a part of this patient's care.  [name] 10/10/2015 10:19 AM

## 2015-10-10 NOTE — Progress Notes (Signed)
Vincent Hoover NAME: Vincent Hoover    MR#:  [id number]  DATE OF BIRTH:  01-Oct-1965  SUBJECTIVE:   Still having some chest pain near the CT site.  No other complaints.   REVIEW OF SYSTEMS:    Review of Systems  Constitutional: Negative for chills, fever and malaise/fatigue.  HENT: Negative for sore throat.   Eyes: Negative for blurred vision, double vision and pain.  Respiratory: Negative for cough, hemoptysis, sputum production, shortness of breath and wheezing.   Cardiovascular: Positive for chest pain (near CT site. ). Negative for palpitations, orthopnea and leg swelling.  Gastrointestinal: Negative for abdominal pain, constipation, diarrhea, heartburn, nausea and vomiting.  Genitourinary: Negative for dysuria and hematuria.  Musculoskeletal: Negative for back pain and joint pain.  Skin: Negative for rash.  Neurological: Negative for sensory change, speech change, focal weakness, weakness and headaches.  Endo/Heme/Allergies: Does not bruise/bleed easily.  Psychiatric/Behavioral: Negative for depression. The patient is not nervous/anxious.     DRUG ALLERGIES:   Allergies  Allergen Reactions  . Bee Venom Anaphylaxis    VITALS:  Blood pressure 118/66, pulse 90, temperature 98.1 F (36.7 C), resp. rate 18, height 5\' 11"  (1.803 m), weight 67.8 kg (149 lb 6.4 oz), SpO2 96 %.  PHYSICAL EXAMINATION:   Physical Exam  GENERAL:  50 y.o.-year-old patient lying in the bed in no acute distress.  EYES: Pupils equal, round, reactive to light and accommodation. No scleral icterus. Extraocular muscles intact.  HEENT: Head atraumatic, normocephalic. Oropharynx and nasopharynx clear.  NECK:  Supple, no jugular venous distention. No thyroid enlargement, no tenderness.  LUNGS: Decreased breath sound left base with ronchi. Left chest tube in place. NO use of accessory muscles. CARDIOVASCULAR: S1, S2 normal. No murmurs, rubs, or  gallops.  ABDOMEN: Soft, nontender, nondistended. Bowel sounds present. No organomegaly or mass.  EXTREMITIES: No cyanosis, clubbing or edema b/l.    NEUROLOGIC: Cranial nerves II through XII are intact. No focal Motor or sensory deficits b/l. PSYCHIATRIC: The patient is alert and oriented x 3.  SKIN: No obvious rash, lesion, or ulcer.   LABORATORY PANEL:   CBC  Recent Labs Lab 10/10/15 0338  WBC 13.6*  HGB 12.4*  HCT 36.4*  PLT 572*   ------------------------------------------------------------------------------------------------------------------ Chemistries   Recent Labs Lab 10/07/15 0406  NA 138  K 3.4*  CL 101  CO2 28  GLUCOSE 128*  BUN 12  CREATININE 0.77  CALCIUM 8.4*   ------------------------------------------------------------------------------------------------------------------  Cardiac Enzymes  Recent Labs Lab 10/06/15 0842  TROPONINI <0.03   ------------------------------------------------------------------------------------------------------------------  RADIOLOGY:  Dg Chest 1 View  Result Date: 10/09/2015 CLINICAL DATA:  Recurrent pleural effusion EXAM: CHEST 1 VIEW COMPARISON:  Portable chest x-ray of October 08, 2015 FINDINGS: There is a small left pleural effusion which has decreased in volume since yesterday's study. The small caliber chest tube is in stable position at the base of the left pleural space. There is persistent atelectasis or infiltrate at the left base. On the right there is patchy interstitial density in the lower lobe which is stable. The upper lobes are relatively clear. The heart and pulmonary vascularity are unremarkable. The mediastinum is normal in width. IMPRESSION: Small left basilar pleural effusion decreased in volume since yesterday's study. There is no pneumothorax. The chest tube is stable in position. Stable interstitial density at both lung bases. Electronically Signed   By: David  Martinique M.D.   On: 10/09/2015 07:03    Dg Chest  Port 1 View  Result Date: 10/08/2015 CLINICAL DATA:  Shortness of breath and chest pain. Status post insertion of left chest thoracostomy tube earlier today. EXAM: PORTABLE CHEST 1 VIEW COMPARISON:  Imaging during chest tube placement procedure earlier today as well as prior chest x-ray on 10/06/2015. FINDINGS: Left basilar pigtail drainage catheter present. There is likely some reduction in pleural fluid since the previous chest x-ray with some residual loculated fluid remaining. No pneumothorax. Underlying consolidation/infiltrate noted in the left lower lung. Right lung base shows stable atelectasis/infiltrate. IMPRESSION: Interval placement of left basilar pigtail pleural drainage catheter. No pneumothorax present. Likely some decrease in left pleural fluid with some residual loculated fluid remaining as well as persistent bilateral lower lobe airspace disease, left greater than right. Electronically Signed   By: Aletta Edouard M.D.   On: 10/08/2015 18:01     ASSESSMENT AND PLAN:   * Acute hypoxic respiratory failure with Hypoxia - secondary to recurrent pneumonia and left Empyema -Status post thoracentesis with incomplete resolution of the effusion. -IR placed chest tube and patient still has significant drainage from it. -Status post TPA and Pulmozyme as per pulmonary. We'll repeat chest x-ray tomorrow to follow up resolution of effusion. -Cultures so far negative. Cont. Unasyn.   * Left-sided chest pain-secondary to the pneumonia and empyema. -Continue supportive care with Toradol, morphine, Percocet.  * Constipation-continue MiraLAX, Colace, Dulcolax, Senna  * Leukocytosis - due to infection.  - will follow with abx therapy. Improving.   All the records are reviewed and case discussed with Care Management/Social Workerr. Management plans discussed with the patient, family and they are in agreement.  CODE STATUS: FULL CODE  DVT Prophylaxis: Lovenox.  TOTAL TIME  TAKING CARE OF THIS PATIENT: 25 minutes.   POSSIBLE D/C IN 1-2 DAYS, DEPENDING ON CLINICAL CONDITION.  Henreitta Leber M.D on 10/10/2015 at 2:58 PM  Between 7am to 6pm - Pager - [phone]  After 6pm go to [email or web address] - password [email or web address] [facility]  [facility]  Office  [phone]  CC: Primary care physician; No PCP Per Patient  Note: This dictation was prepared with Dragon dictation along with smaller phrase technology. Any transcriptional errors that result from this process are unintentional.

## 2015-10-10 NOTE — Progress Notes (Signed)
* [facility]     Assessment and Plan:  Empyema. --s/p chest tube placement in left pleural space by IR.  --Status post instillation of TPA/pulmozyme combination yesterday.   -Expected hemorrhagic pleural effusion present after instillation of TPA --Repeat CXR 2 view today.   Pleuritic Chest pain.  --Secondary to above, continue currently management, pain meds as needed.  -Added Toradol for pain today.  Pneumonia.  --LLL pneumonia, continue abx.   Nicotine Abuse.  --Likely contributory, discussed smoking cessation  Addendum: --On 8/25: Instilled 10 mg TPA and 5 mg pulmozyme instilled via chest tube x 2 today, chest tube clamped for 1 hour after each episode. Tolerated well.    Date: 10/10/2015  MRN# [id number] Vincent Hoover 1965-04-12   Vincent Hoover. is a 50 y.o. old male seen in follow up for chief complaint of  Chief Complaint  Patient presents with  . Chest Pain  . Shortness of Breath     HPI:  Worse left pleuritic chest pain.  Medication:   Reviewed.   Allergies:  Bee venom  Review of Systems: Gen:  Denies  fever, sweats. HEENT: Denies blurred vision. Cvc:  No dizziness, chest pain or heaviness Resp:   Denies cough or sputum porduction. Gi: Denies swallowing difficulty, stomach pain.  Gu:  Denies bladder incontinence, burning urine Ext:   No Joint pain, stiffness. Skin: No skin rash, easy bruising. Endoc:  No polyuria, polydipsia. Psych: No depression, insomnia. Other:  All other systems were reviewed and found to be negative other than what is mentioned in the HPI.   Physical Examination:   VS: BP 118/66 (BP Location: Left Arm)   Pulse 90   Temp 98.1 F (36.7 C)   Resp 18   Ht 5\' 11"  (1.803 m)   Wt 149 lb 6.4 oz (67.8 kg)   SpO2 96%   BMI 20.84 kg/m   General Appearance: No distress  Neuro:without focal findings,  speech normal,  HEENT: PERRLA, EOM intact. Pulmonary: Continued normal breath sounds,  decreased air entry in left lung.   CardiovascularNormal S1,S2.  No m/r/g.   Abdomen: Benign, Soft, non-tender. Renal:  No costovertebral tenderness  GU:  Not performed at this time. Endoc: No evident thyromegaly, no signs of acromegaly. Skin:   warm, no rash. Extremities: normal, no cyanosis, clubbing.   LABORATORY PANEL:   CBC  Recent Labs Lab 10/10/15 0338  WBC 13.6*  HGB 12.4*  HCT 36.4*  PLT 572*   ------------------------------------------------------------------------------------------------------------------  Chemistries   Recent Labs Lab 10/07/15 0406  NA 138  K 3.4*  CL 101  CO2 28  GLUCOSE 128*  BUN 12  CREATININE 0.77  CALCIUM 8.4*   ------------------------------------------------------------------------------------------------------------------  Cardiac Enzymes  Recent Labs Lab 10/06/15 0842  TROPONINI <0.03   ------------------------------------------------------------  RADIOLOGY:   No results found for this or any previous visit. Results for orders placed during the hospital encounter of 10/06/15  DG Chest 2 View   Narrative CLINICAL DATA:  Left-sided chest pain and shortness of breath and fever for 5 days.  EXAM: CHEST  2 VIEW  COMPARISON:  08/14/2015  FINDINGS: The cardiomediastinal silhouette is largely obscured by a large left pleural effusion and/or airspace consolidation, obscuring most of the left hemithorax. A rounded density overlying the anterior mediastinum on the lateral view likely represents loculated effusion. There is no evidence of pneumothorax. The right lung is clear.  Osseous structures are without acute abnormality. Soft tissues are grossly normal.  IMPRESSION:  Interval development of large left pleural effusion with probable associated airspace consolidation, obscuring 2/3 of the left hemithorax.  No evidence of pneumothorax.   Electronically Signed   By: Fidela Salisbury M.D.   On: 10/06/2015  09:27    ------------------------------------------------------------------------------------------------------------------  Thank  you for allowing [facility] to assist in the care of your patient. Our recommendations are noted above.  Please contact us if we can be of further service.   Marda Stalker, MD.  [facility] Office Number: [phone]  Patricia Pesa, M.D.  Vilinda Boehringer, M.D.  Merton Border, M.D  10/10/2015

## 2015-10-11 LAB — CULTURE, BLOOD (ROUTINE X 2)
CULTURE: NO GROWTH
CULTURE: NO GROWTH

## 2015-10-11 LAB — CBC
HCT: 35.9 % — ABNORMAL LOW (ref 40.0–52.0)
Hemoglobin: 12.4 g/dL — ABNORMAL LOW (ref 13.0–18.0)
MCH: 30.5 pg (ref 26.0–34.0)
MCHC: 34.6 g/dL (ref 32.0–36.0)
MCV: 88.3 fL (ref 80.0–100.0)
PLATELETS: 655 10*3/uL — AB (ref 150–440)
RBC: 4.07 MIL/uL — AB (ref 4.40–5.90)
RDW: 13.5 % (ref 11.5–14.5)
WBC: 12.7 10*3/uL — AB (ref 3.8–10.6)

## 2015-10-11 MED ORDER — SODIUM CHLORIDE 0.9% FLUSH
3.0000 mL | Freq: Two times a day (BID) | INTRAVENOUS | Status: DC
  Administered 2015-10-11 – 2015-10-13 (×6): 3 mL via INTRAVENOUS

## 2015-10-11 MED ORDER — SODIUM CHLORIDE 0.9% FLUSH
3.0000 mL | INTRAVENOUS | Status: DC | PRN
  Administered 2015-10-11 – 2015-10-12 (×5): 3 mL via INTRAVENOUS
  Filled 2015-10-11 (×5): qty 3

## 2015-10-11 NOTE — Progress Notes (Signed)
Pt requesting alternating doses of morphine, percocet and toradol at ordered frequencies due to continued pain at chest tube site. Dr. Juanell Fairly. iInstilled medications with still time 1 hour with tube unclamped in 1 hour twice today with suction discontinued.  Pt ambulated around NS x 1- refused other offers to ambulate. Dsg around Chest Tube  site changed with site normal. Large amount red to serosangious bloody drainage via chest tube.

## 2015-10-11 NOTE — Progress Notes (Signed)
[place] [facility] [place] NAME: Vincent Hoover    MR#:  [id number]  DATE OF BIRTH:  03/06/1965  SUBJECTIVE:   Pain near the CT site.  No other complaints.    REVIEW OF SYSTEMS:    Review of Systems  Constitutional: Negative for chills, fever and malaise/fatigue.  HENT: Negative for sore throat.   Eyes: Negative for blurred vision, double vision and pain.  Respiratory: Negative for cough, hemoptysis, sputum production, shortness of breath and wheezing.   Cardiovascular: Positive for chest pain (near CT site. ). Negative for palpitations, orthopnea and leg swelling.  Gastrointestinal: Negative for abdominal pain, constipation, diarrhea, heartburn, nausea and vomiting.  Genitourinary: Negative for dysuria and hematuria.  Musculoskeletal: Negative for back pain and joint pain.  Skin: Negative for rash.  Neurological: Negative for sensory change, speech change, focal weakness, weakness and headaches.  Endo/Heme/Allergies: Does not bruise/bleed easily.  Psychiatric/Behavioral: Negative for depression. The patient is not nervous/anxious.     DRUG ALLERGIES:   Allergies  Allergen Reactions  . Bee Venom Anaphylaxis    VITALS:  Blood pressure 126/73, pulse 75, temperature 97.9 F (36.6 C), temperature source Oral, resp. rate 20, height 5\' 11"  (1.803 m), weight 67.8 kg (149 lb 6.4 oz), SpO2 98 %.  PHYSICAL EXAMINATION:   Physical Exam  GENERAL:  50 y.o.-year-old patient lying in the bed in no acute distress.  EYES: Pupils equal, round, reactive to light and accommodation. No scleral icterus. Extraocular muscles intact.  HEENT: Head atraumatic, normocephalic. Oropharynx and nasopharynx clear.  NECK:  Supple, no jugular venous distention. No thyroid enlargement, no tenderness.  LUNGS: Decreased breath sounds left base with ronchi. Left chest tube in place. NO use of accessory muscles. CARDIOVASCULAR: S1, S2 normal. No murmurs, rubs, or  gallops.  ABDOMEN: Soft, nontender, nondistended. Bowel sounds present. No organomegaly or mass.  EXTREMITIES: No cyanosis, clubbing or edema b/l.    NEUROLOGIC: Cranial nerves II through XII are intact. No focal Motor or sensory deficits b/l. PSYCHIATRIC: The patient is alert and oriented x 3.  SKIN: No obvious rash, lesion, or ulcer.   LABORATORY PANEL:   CBC  Recent Labs Lab 10/11/15 0527  WBC 12.7*  HGB 12.4*  HCT 35.9*  PLT 655*   ------------------------------------------------------------------------------------------------------------------ Chemistries   Recent Labs Lab 10/07/15 0406  NA 138  K 3.4*  CL 101  CO2 28  GLUCOSE 128*  BUN 12  CREATININE 0.77  CALCIUM 8.4*   ------------------------------------------------------------------------------------------------------------------  Cardiac Enzymes  Recent Labs Lab 10/06/15 0842  TROPONINI <0.03   ------------------------------------------------------------------------------------------------------------------  RADIOLOGY:  Dg Chest 2 View  Result Date: 10/10/2015 CLINICAL DATA:  Chest tube.  Pleural effusion. EXAM: CHEST  2 VIEW COMPARISON:  10/09/2015. FINDINGS: Two-view exam shows a a pigtail catheter overlying the posterior aspect of the lower left hemi thorax. Left pleural effusion appears slightly decreased in the interval. No evidence for pneumothorax. Both lung bases are better aerated than on the previous study. The cardiopericardial silhouette is within normal limits for size. The visualized bony structures of the thorax are intact. IMPRESSION: Improved aeration with slight decrease in left pleural effusion. Electronically Signed   By: Misty Stanley M.D.   On: 10/10/2015 21:17     ASSESSMENT AND PLAN:   * Acute hypoxic respiratory failure with Hypoxia - secondary to pneumonia and left sided Empyema -Status post thoracentesis with incomplete resolution of the effusion. - s/p IR chest tube  placement.  -Status post TPA and  Pulmozyme as per Pulmonary and CXR from yesterday showing improvement.  - will repeat CXR later today and CT clamped today.  If continues to improve then d/c home early next week. Discussed w/ Dr. Ashby Dawes.  -Cultures so far negative. Cont. Unasyn.   * Left-sided chest pain-secondary to the pneumonia and empyema. -Continue supportive care with Toradol, morphine, Percocet.  * Constipation-continue MiraLAX, Colace, Dulcolax, Senna - resolved.   * Leukocytosis - due to infection.  - trending down w/ IV abx.   Likely d/c early next week.   All the records are reviewed and case discussed with Care Management/Social Workerr. Management plans discussed with the patient, family and they are in agreement.  CODE STATUS: FULL CODE  DVT Prophylaxis: Lovenox.  TOTAL TIME TAKING CARE OF THIS PATIENT: 25 minutes.   POSSIBLE D/C IN 1-2 DAYS, DEPENDING ON CLINICAL CONDITION.  Henreitta Leber M.D on 10/11/2015 at 12:37 PM  Between 7am to 6pm - Pager - [phone]  After 6pm go to [email or web address] - password [email or web address] [facility]  [facility]  Office  [phone]  CC: Primary care physician; No PCP Per Patient  Note: This dictation was prepared with Dragon dictation along with smaller phrase technology. Any transcriptional errors that result from this process are unintentional.

## 2015-10-11 NOTE — Progress Notes (Addendum)
* [facility]     Assessment and Plan:  Empyema. --s/p chest tube placement in left pleural space by IR.  --Status post instillation of TPA/pulmozyme combination yesterday.   Chest tube output: 8/25: 550 8/24: 760 -Expected hemorrhagic pleural effusion present after instillation of TPA --review of CXR images shows significant improvement in underlying empyema with an expected degree in the reduction of loculation.  --Will administer final 2 doses of TPA/pulmozyme if can be supplied by pharmacy.  --Will change chest tube to water seal today.   Pleuritic Chest pain.  --Secondary to above, improving.  -Continue prn pain meds. Minimize use of toradol.   Pneumonia.  --LLL pneumonia, continue abx.   Nicotine Abuse.  --Likely contributory, discussed smoking cessation  Addendum: --On 8/26: Instilled 10 mg TPA and 5 mg pulmozyme instilled via chest tube x 2 today.  chest tube clamped for 1 hour after each episode.     Date: 10/11/2015  MRN# [id number] Vincent Hoover [birth year]   Vincent Hoover. is a 50 y.o. old male seen in follow up for chief complaint of  Chief Complaint  Patient presents with  . Chest Pain  . Shortness of Breath     HPI:  Worse left pleuritic chest pain. Otherwise breathing feels much better. Discussed patient with pharmacist to obtain medication, discussed potential discharge on Monday, discussed chest tube management with RN, and reviewed with patient.   Medication:   Reviewed.   Allergies:  Bee venom  Review of Systems: Gen:  Denies  fever, sweats. HEENT: Denies blurred vision. Cvc:  No dizziness, chest pain or heaviness Resp:   Denies cough or sputum porduction. Gi: Denies swallowing difficulty, stomach pain.  Gu:  Denies bladder incontinence, burning urine Ext:   No Joint pain, stiffness. Skin: No skin rash, easy bruising. Endoc:  No polyuria, polydipsia. Psych: No depression, insomnia. Other:  All other  systems were reviewed and found to be negative other than what is mentioned in the HPI.   Physical Examination:   VS: BP 126/73 (BP Location: Left Arm)   Pulse 75   Temp 97.9 F (36.6 C) (Oral)   Resp 20   Ht 5\' 11"  (1.803 m)   Wt 149 lb 6.4 oz (67.8 kg)   SpO2 98%   BMI 20.84 kg/m   General Appearance: No distress  Neuro:without focal findings,  speech normal,  HEENT: PERRLA, EOM intact. Pulmonary: Continued normal breath sounds, decreased air entry in left lung.   CardiovascularNormal S1,S2.  No m/r/g.   Abdomen: Benign, Soft, non-tender. Renal:  No costovertebral tenderness  GU:  Not performed at this time. Endoc: No evident thyromegaly, no signs of acromegaly. Skin:   warm, no rash. Extremities: normal, no cyanosis, clubbing.   LABORATORY PANEL:   CBC  Recent Labs Lab 10/11/15 0527  WBC 12.7*  HGB 12.4*  HCT 35.9*  PLT 655*   ------------------------------------------------------------------------------------------------------------------  Chemistries   Recent Labs Lab 10/07/15 0406  NA 138  K 3.4*  CL 101  CO2 28  GLUCOSE 128*  BUN 12  CREATININE 0.77  CALCIUM 8.4*   ------------------------------------------------------------------------------------------------------------------  Cardiac Enzymes  Recent Labs Lab 10/06/15 0842  TROPONINI <0.03   ------------------------------------------------------------  RADIOLOGY:   No results found for this or any previous visit. Results for orders placed during the hospital encounter of 10/06/15  DG Chest 2 View   Narrative CLINICAL DATA:  Left-sided chest pain and shortness of breath and fever for 5 days.  EXAM:  CHEST  2 VIEW  COMPARISON:  08/14/2015  FINDINGS: The cardiomediastinal silhouette is largely obscured by a large left pleural effusion and/or airspace consolidation, obscuring most of the left hemithorax. A rounded density overlying the anterior mediastinum on the lateral view  likely represents loculated effusion. There is no evidence of pneumothorax. The right lung is clear.  Osseous structures are without acute abnormality. Soft tissues are grossly normal.  IMPRESSION: Interval development of large left pleural effusion with probable associated airspace consolidation, obscuring 2/3 of the left hemithorax.  No evidence of pneumothorax.   Electronically Signed   By: Fidela Salisbury M.D.   On: 10/06/2015 09:27    ------------------------------------------------------------------------------------------------------------------  Thank  you for allowing [facility] Care to assist in the care of your patient. Our recommendations are noted above.  Please contact us if we can be of further service.   Marda Stalker, MD.  [facility] Office Number: [phone]  Patricia Pesa, M.D.  Vilinda Boehringer, M.D.  Merton Border, M.D  10/11/2015

## 2015-10-12 ENCOUNTER — Inpatient Hospital Stay: Payer: Medicaid Other

## 2015-10-12 LAB — BASIC METABOLIC PANEL
ANION GAP: 6 (ref 5–15)
BUN: 15 mg/dL (ref 6–20)
CO2: 29 mmol/L (ref 22–32)
Calcium: 8.4 mg/dL — ABNORMAL LOW (ref 8.9–10.3)
Chloride: 103 mmol/L (ref 101–111)
Creatinine, Ser: 0.61 mg/dL (ref 0.61–1.24)
GFR calc Af Amer: >60 mL/min (ref >60)
GLUCOSE: 100 mg/dL — AB (ref 65–99)
POTASSIUM: 3.9 mmol/L (ref 3.5–5.1)
Sodium: 138 mmol/L (ref 135–145)

## 2015-10-12 MED ORDER — OXYCODONE-ACETAMINOPHEN 5-325 MG PO TABS
1.0000 | ORAL_TABLET | ORAL | Status: DC | PRN
  Administered 2015-10-12 – 2015-10-13 (×5): 2 via ORAL
  Filled 2015-10-12 (×5): qty 2

## 2015-10-12 MED ORDER — MORPHINE SULFATE (PF) 2 MG/ML IV SOLN
2.0000 mg | Freq: Four times a day (QID) | INTRAVENOUS | Status: DC | PRN
  Administered 2015-10-12 – 2015-10-13 (×3): 2 mg via INTRAVENOUS
  Filled 2015-10-12 (×3): qty 1

## 2015-10-12 NOTE — Progress Notes (Signed)
* [facility]     Assessment and Plan:  Empyema. --s/p chest tube placement in left pleural space by IR.  --Status post instillation of TPA/pulmozyme combination x 6 doses.   Chest tube output: 8/25: 550 8/24: 760 -Expected hemorrhagic pleural effusion present after instillation of TPA --review of CXR images shows continues to show significant improvement in underlying empyema with an expected degree of continued loculated fluid.  --Continue water seal.  --If drainage is less than 200-300 cc today, can dc chest tube and discharge patient tomorrow.   Pleuritic Chest pain.  --Secondary to above, improving.  -Continue prn pain meds. Minimize use of toradol.   Pneumonia.  --LLL pneumonia, continue abx.   Nicotine Abuse.  --Likely contributory, discussed smoking cessation  Addendum: --On 8/26: Instilled 10 mg TPA and 5 mg pulmozyme instilled via chest tube x 2 today.  chest tube clamped for 1 hour after each episode.     Date: 10/12/2015  MRN# [id number] Vincent Hoover [birth year]   Vincent Hoover. is a 50 y.o. old male seen in follow up for chief complaint of  Chief Complaint  Patient presents with  . Chest Pain  . Shortness of Breath     HPI:  Worse left pleuritic chest pain. Otherwise breathing feels much better. Discussed patient with pharmacist to obtain medication, discussed potential discharge on Monday, discussed chest tube management with RN, and reviewed with patient.   Medication:   Reviewed.   Allergies:  Bee venom  Review of Systems: Gen:  Denies  fever, sweats. HEENT: Denies blurred vision. Cvc:  No dizziness, chest pain or heaviness Resp:   Denies cough or sputum porduction. Gi: Denies swallowing difficulty, stomach pain.  Gu:  Denies bladder incontinence, burning urine Ext:   No Joint pain, stiffness. Skin: No skin rash, easy bruising. Endoc:  No polyuria, polydipsia. Psych: No depression, insomnia. Other:  All other  systems were reviewed and found to be negative other than what is mentioned in the HPI.   Physical Examination:   VS: BP 122/67   Pulse 78   Temp 98.2 F (36.8 C) (Oral)   Resp 20   Ht 5\' 11"  (1.803 m)   Wt 149 lb 6.4 oz (67.8 kg)   SpO2 98%   BMI 20.84 kg/m   General Appearance: No distress  Neuro:without focal findings,  speech normal,  HEENT: PERRLA, EOM intact. Pulmonary: Continued normal breath sounds, decreased air entry in left lung.   CardiovascularNormal S1,S2.  No m/r/g.   Abdomen: Benign, Soft, non-tender. Renal:  No costovertebral tenderness  GU:  Not performed at this time. Endoc: No evident thyromegaly, no signs of acromegaly. Skin:   warm, no rash. Extremities: normal, no cyanosis, clubbing.   LABORATORY PANEL:   CBC  Recent Labs Lab 10/11/15 0527  WBC 12.7*  HGB 12.4*  HCT 35.9*  PLT 655*   ------------------------------------------------------------------------------------------------------------------  Chemistries   Recent Labs Lab 10/12/15 0419  NA 138  K 3.9  CL 103  CO2 29  GLUCOSE 100*  BUN 15  CREATININE 0.61  CALCIUM 8.4*   ------------------------------------------------------------------------------------------------------------------  Cardiac Enzymes  Recent Labs Lab 10/06/15 0842  TROPONINI <0.03   ------------------------------------------------------------  RADIOLOGY:   No results found for this or any previous visit. Results for orders placed during the hospital encounter of 10/06/15  DG Chest 2 View   Narrative CLINICAL DATA:  Left-sided chest pain and shortness of breath and fever for 5 days.  EXAM: CHEST  2 VIEW  COMPARISON:  08/14/2015  FINDINGS: The cardiomediastinal silhouette is largely obscured by a large left pleural effusion and/or airspace consolidation, obscuring most of the left hemithorax. A rounded density overlying the anterior mediastinum on the lateral view likely represents  loculated effusion. There is no evidence of pneumothorax. The right lung is clear.  Osseous structures are without acute abnormality. Soft tissues are grossly normal.  IMPRESSION: Interval development of large left pleural effusion with probable associated airspace consolidation, obscuring 2/3 of the left hemithorax.  No evidence of pneumothorax.   Electronically Signed   By: Fidela Salisbury M.D.   On: 10/06/2015 09:27    ------------------------------------------------------------------------------------------------------------------  Thank  you for allowing [facility] to assist in the care of your patient. Our recommendations are noted above.  Please contact us if we can be of further service.   Marda Stalker, MD.   Pulmonary and Critical Care Office Number: [phone]  Patricia Pesa, M.D.  Vilinda Boehringer, M.D.  Merton Border, M.D  10/12/2015

## 2015-10-12 NOTE — Progress Notes (Signed)
[facility] [place] NAME: Vincent Hoover    MR#:  [id number]  DATE OF BIRTH:  [birth year]  SUBJECTIVE:   Still has significant Drainage from CT overnight and yesterday.  Still having some pain near the CT site.    REVIEW OF SYSTEMS:    Review of Systems  Constitutional: Negative for chills, fever and malaise/fatigue.  HENT: Negative for sore throat.   Eyes: Negative for blurred vision, double vision and pain.  Respiratory: Negative for cough, hemoptysis, sputum production, shortness of breath and wheezing.   Cardiovascular: Positive for chest pain (near CT site. ). Negative for palpitations, orthopnea and leg swelling.  Gastrointestinal: Negative for abdominal pain, constipation, diarrhea, heartburn, nausea and vomiting.  Genitourinary: Negative for dysuria and hematuria.  Musculoskeletal: Negative for back pain and joint pain.  Skin: Negative for rash.  Neurological: Negative for sensory change, speech change, focal weakness, weakness and headaches.  Endo/Heme/Allergies: Does not bruise/bleed easily.  Psychiatric/Behavioral: Negative for depression. The patient is not nervous/anxious.     DRUG ALLERGIES:   Allergies  Allergen Reactions  . Bee Venom Anaphylaxis    VITALS:  Blood pressure 122/67, pulse 78, temperature 98.2 F (36.8 C), temperature source Oral, resp. rate 20, height 5\' 11"  (1.803 m), weight 67.8 kg (149 lb 6.4 oz), SpO2 98 %.  PHYSICAL EXAMINATION:   Physical Exam  GENERAL:  50 y.o.-year-old patient lying in the bed in no acute distress.  EYES: Pupils equal, round, reactive to light and accommodation. No scleral icterus. Extraocular muscles intact.  HEENT: Head atraumatic, normocephalic. Oropharynx and nasopharynx clear.  NECK:  Supple, no jugular venous distention. No thyroid enlargement, no tenderness.  LUNGS: Good A/E B/l.  NO rales, rhonchi, wheezes. Left chest tube in place. NO use of accessory  muscles. CARDIOVASCULAR: S1, S2 normal. No murmurs, rubs, or gallops.  ABDOMEN: Soft, nontender, nondistended. Bowel sounds present. No organomegaly or mass.  EXTREMITIES: No cyanosis, clubbing or edema b/l.    NEUROLOGIC: Cranial nerves II through XII are intact. No focal Motor or sensory deficits b/l. PSYCHIATRIC: The patient is alert and oriented x 3.  SKIN: No obvious rash, lesion, or ulcer.   LABORATORY PANEL:   CBC  Recent Labs Lab 10/11/15 0527  WBC 12.7*  HGB 12.4*  HCT 35.9*  PLT 655*   ------------------------------------------------------------------------------------------------------------------ Chemistries   Recent Labs Lab 10/12/15 0419  NA 138  K 3.9  CL 103  CO2 29  GLUCOSE 100*  BUN 15  CREATININE 0.61  CALCIUM 8.4*   ------------------------------------------------------------------------------------------------------------------  Cardiac Enzymes  Recent Labs Lab 10/06/15 0842  TROPONINI <0.03   ------------------------------------------------------------------------------------------------------------------  RADIOLOGY:  Dg Chest 1 View  Result Date: 10/12/2015 CLINICAL DATA:  Continued shortness of breath. EXAM: CHEST 1 VIEW COMPARISON:  10/10/2015 FINDINGS: Normal heart size. There is a left-sided chest tube in place. Tiny loculated basilar hydro pneumothorax noted. Unchanged from previous exam. Right lung appears clear. IMPRESSION: 1. Stable exam. No change in tiny left basilar hydro pneumothorax status post chest tube placement. Electronically Signed   By: Kerby Moors M.D.   On: 10/12/2015 11:19   Dg Chest 2 View  Result Date: 10/10/2015 CLINICAL DATA:  Chest tube.  Pleural effusion. EXAM: CHEST  2 VIEW COMPARISON:  10/09/2015. FINDINGS: Two-view exam shows a a pigtail catheter overlying the posterior aspect of the lower left hemi thorax. Left pleural effusion appears slightly decreased in the interval. No evidence for pneumothorax. Both  lung bases are better aerated  than on the previous study. The cardiopericardial silhouette is within normal limits for size. The visualized bony structures of the thorax are intact. IMPRESSION: Improved aeration with slight decrease in left pleural effusion. Electronically Signed   By: Misty Stanley M.D.   On: 10/10/2015 21:17     ASSESSMENT AND PLAN:   * Acute hypoxic respiratory failure with Hypoxia - secondary to pneumonia and left sided Empyema -Status post thoracentesis with incomplete resolution of the effusion. - s/p IR chest tube placement.  -Status post TPA and Pulmozyme as per Pulmonary and CXR from 8/25 showing improvement.  - still having significant drainage from the chest tube overnight. If drainage is less than 300 cc today then likely d/c CT in a.m. Tomorrow.  Discussed w/ Dr. Ashby Dawes.  -Cultures so far negative. Cont. Unasyn (Day 6/10) for now.     * Left-sided chest pain-secondary to the pneumonia and empyema. -Continue supportive care with Toradol, advanced Percocet. - avoid IV Morphine if possible and discussed w/ pt.  * Constipation-continue MiraLAX, Colace, Dulcolax, Senna - resolved.   * Leukocytosis - due to infection.  - trending down w/ IV abx.   Possible d/c early next week.     All the records are reviewed and case discussed with Care Management/Social Workerr. Management plans discussed with the patient, family and they are in agreement.  CODE STATUS: FULL CODE  DVT Prophylaxis: Lovenox.  TOTAL TIME TAKING CARE OF THIS PATIENT: 25 minutes.   POSSIBLE D/C IN 1-2 DAYS, DEPENDING ON CLINICAL CONDITION.  Henreitta Leber M.D on 10/12/2015 at 1:03 PM  Between 7am to 6pm - Pager - [phone]  After 6pm go to [email or web address] - password [email or web address] [facility]  [facility]  Office  [phone]  CC: Primary care physician; No PCP Per Patient  Note: This dictation was prepared with Dragon dictation along with smaller phrase technology. Any  transcriptional errors that result from this process are unintentional.

## 2015-10-13 ENCOUNTER — Inpatient Hospital Stay: Payer: Medicaid Other

## 2015-10-13 MED ORDER — OXYCODONE-ACETAMINOPHEN 5-325 MG PO TABS: 1.0000 | ORAL_TABLET | ORAL | 0 refills | Status: DC | PRN

## 2015-10-13 MED ORDER — AMOXICILLIN-POT CLAVULANATE 875-125 MG PO TABS: 1.0000 | ORAL_TABLET | Freq: Two times a day (BID) | ORAL | 0 refills | Status: DC

## 2015-10-13 NOTE — Discharge Summary (Signed)
[facility] [place] NAME: Vincent Hoover    MR#:  [id number]  DATE OF BIRTH:  12/27/1965  DATE OF ADMISSION:  10/06/2015 ADMITTING PHYSICIAN: Vaughan Basta, MD  DATE OF DISCHARGE: 10/13/2015  PRIMARY CARE PHYSICIAN: No PCP Per Patient    ADMISSION DIAGNOSIS:  Dyspnea [R06.00] Pleural effusion [J90] Status post thoracentesis [Z98.890] Pleural effusion, left [J94.8]  DISCHARGE DIAGNOSIS:  Principal Problem:   Pleural effusion Active Problems:   Recurrent pneumonia   Pneumonia   SECONDARY DIAGNOSIS:   Past Medical History:  Diagnosis Date  . Arthritis   . Cancer ([place])   . Melanoma [facility])     HOSPITAL COURSE:   50 year old male with past medical history of osteoarthritis, melanoma who presented to the hospital with shortness of breath and noted to have pneumonia with empyema.  * Acute hypoxic respiratory failure with Hypoxia - secondary to pneumonia and left sided Empyema -Patient was started on broad-spectrum IV antibiotics with Unasyn. He underwent ultrasound-guided thoracentesis with incomplete resolution of his effusion. -Therefore patient underwent IR guided chest tube placement. A pulmonary consult was obtained and patient was given multiple treatments with TPA and Pulmozyme. -His effusion has improved since admission. He had minimal drainage from his chest tube site over the past 24 hours. He is being discharged home as his chest tube has been pulled by pulmonary. -He will empirically be discharged on oral Augmentin for an additional 7 days. He will follow-up with pulmonary within the next week and have a repeat x-ray done.  * Left-sided chest pain-secondary to the pneumonia and empyema. -This has improved since admission. He is being discharged on some as needed Norco for his pleuritic pain.  * Constipation-resolved with Miralax, Colace, Senna.   * Leukocytosis - due to infection.  - improved w/ IV abx.     DISCHARGE CONDITIONS:   Stable.   CONSULTS OBTAINED:    DRUG ALLERGIES:   Allergies  Allergen Reactions  . Bee Venom Anaphylaxis    DISCHARGE MEDICATIONS:     Medication List    TAKE these medications   amoxicillin-clavulanate 875-125 MG tablet Commonly known as:  AUGMENTIN Take 1 tablet by mouth 2 (two) times daily.   oxyCODONE-acetaminophen 5-325 MG tablet Commonly known as:  PERCOCET/ROXICET Take 1-2 tablets by mouth every 4 (four) hours as needed for moderate pain.         DISCHARGE INSTRUCTIONS:   DIET:  Regular diet  DISCHARGE CONDITION:  Stable  ACTIVITY:  Activity as tolerated  OXYGEN:  Home Oxygen: No.   Oxygen Delivery: room air  DISCHARGE LOCATION:  home   If you experience worsening of your admission symptoms, develop shortness of breath, life threatening emergency, suicidal or homicidal thoughts you must seek medical attention immediately by calling 911 or calling your MD immediately  if symptoms less severe.  You Must read complete instructions/literature along with all the possible adverse reactions/side effects for all the Medicines you take and that have been prescribed to you. Take any new Medicines after you have completely understood and accpet all the possible adverse reactions/side effects.   Please note  You were cared for by a hospitalist during your hospital stay. If you have any questions about your discharge medications or the care you received while you were in the hospital after you are discharged, you can call the unit and asked to speak with the hospitalist on call if the hospitalist that took care of you is not available. Once  you are discharged, your primary care physician will handle any further medical issues. Please note that NO REFILLS for any discharge medications will be authorized once you are discharged, as it is imperative that you return to your primary care physician (or establish a relationship with a primary  care physician if you do not have one) for your aftercare needs so that they can reassess your need for medications and monitor your lab values.     Today   Mild CP near CT tube site.  Minimal drainage (120 cc) over the past 24 hrs from CT.  Afebrile, hemodynamically stable.  No shortness of breath.   VITAL SIGNS:  Blood pressure 123/65, pulse 77, temperature 98.4 F (36.9 C), temperature source Oral, resp. rate 18, height 5\' 11"  (1.803 m), weight 67.8 kg (149 lb 6.4 oz), SpO2 99 %.  I/O:   Intake/Output Summary (Last 24 hours) at 10/13/15 1357 Last data filed at 10/13/15 1300  Gross per 24 hour  Intake              440 ml  Output              321 ml  Net              119 ml    PHYSICAL EXAMINATION:   GENERAL:  50 y.o.-year-old patient lying in the bed in no acute distress.  EYES: Pupils equal, round, reactive to light and accommodation. No scleral icterus. Extraocular muscles intact.  HEENT: Head atraumatic, normocephalic. Oropharynx and nasopharynx clear.  NECK:  Supple, no jugular venous distention. No thyroid enlargement, no tenderness.  LUNGS: Decreased breath sounds left base with minimal rhonchi., no rales, wheezing. NO use of accessory muscles. CARDIOVASCULAR: S1, S2 normal. No murmurs, rubs, or gallops.  ABDOMEN: Soft, nontender, nondistended. Bowel sounds present. No organomegaly or mass.  EXTREMITIES: No cyanosis, clubbing or edema b/l.    NEUROLOGIC: Cranial nerves II through XII are intact. No focal Motor or sensory deficits b/l. PSYCHIATRIC: The patient is alert and oriented x 3.  SKIN: No obvious rash, lesion, or ulcer.   DATA REVIEW:   CBC  Recent Labs Lab 10/11/15 0527  WBC 12.7*  HGB 12.4*  HCT 35.9*  PLT 655*    Chemistries   Recent Labs Lab 10/12/15 0419  NA 138  K 3.9  CL 103  CO2 29  GLUCOSE 100*  BUN 15  CREATININE 0.61  CALCIUM 8.4*    Cardiac Enzymes No results for input(s): TROPONINI in the last 168 hours.  Microbiology  Results  Results for orders placed or performed during the hospital encounter of 10/06/15  Blood culture (routine x 2)     Status: None   Collection Time: 10/06/15  9:56 AM  Result Value Ref Range Status   Specimen Description BLOOD LEFT WRIST  Final   Special Requests   Final    BOTTLES DRAWN AEROBIC AND ANAEROBIC AER 10ML ANA 11ML   Culture NO GROWTH 5 DAYS  Final   Report Status 10/11/2015 FINAL  Final  Blood culture (routine x 2)     Status: None   Collection Time: 10/06/15  9:56 AM  Result Value Ref Range Status   Specimen Description BLOOD RIGHT AC  Final   Special Requests   Final    BOTTLES DRAWN AEROBIC AND ANAEROBIC AER 9ML ANA 4ML   Culture NO GROWTH 5 DAYS  Final   Report Status 10/11/2015 FINAL  Final  Body fluid culture  Status: None   Collection Time: 10/06/15  2:12 PM  Result Value Ref Range Status   Specimen Description PLEURAL  Final   Special Requests NONE  Final   Gram Stain   Final    FEW WBC PRESENT, PREDOMINANTLY PMN NO ORGANISMS SEEN    Culture   Final    NO GROWTH 3 DAYS Performed at [facility]    Report Status 10/10/2015 FINAL  Final    RADIOLOGY:  Dg Chest 1 View  Result Date: 10/13/2015 CLINICAL DATA:  Followup left hydro-pneumothorax. EXAM: CHEST 1 VIEW COMPARISON:  10/12/2015 FINDINGS: Left pleural drainage catheter remains in place. Elevation of left hemidiaphragm is unchanged. Tiny left basilar hydro-pneumothorax remains stable. Atelectasis or scarring in left lung base is unchanged. Right lung is clear. Heart size is normal. IMPRESSION: Stable tiny left basilar hydro-pneumothorax, with left pleural drainage catheter in place. Stable left lower lung atelectasis versus scarring. Electronically Signed   By: Earle Gell M.D.   On: 10/13/2015 11:11   Dg Chest 1 View  Result Date: 10/12/2015 CLINICAL DATA:  Continued shortness of breath. EXAM: CHEST 1 VIEW COMPARISON:  10/10/2015 FINDINGS: Normal heart size. There is a left-sided chest  tube in place. Tiny loculated basilar hydro pneumothorax noted. Unchanged from previous exam. Right lung appears clear. IMPRESSION: 1. Stable exam. No change in tiny left basilar hydro pneumothorax status post chest tube placement. Electronically Signed   By: Kerby Moors M.D.   On: 10/12/2015 11:19      Management plans discussed with the patient, family and they are in agreement.  CODE STATUS:     Code Status Orders        Start     Ordered   10/06/15 1440  Full code  Continuous     10/06/15 1439    Code Status History    Date Active Date Inactive Code Status Order ID Comments User Context   This patient has a current code status but no historical code status.      TOTAL TIME TAKING CARE OF THIS PATIENT: 40 minutes.    Henreitta Leber M.D on 10/13/2015 at 1:57 PM  Between 7am to 6pm - Pager - [phone]  After 6pm go to [email or web address] - [employer] [facility]  Office  [phone]  CC: Primary care physician; No PCP Per Patient

## 2015-10-13 NOTE — Care Management (Signed)
Discharge to home today per Dr. Verdell Carmine. Information on [facility] and [facility]. States he will ask his son about available physicians in [place] taking new patients. The only medications will be pain medications. Son will transport Vincent Ammons RN MSN [facility] [phone]

## 2015-10-13 NOTE — Progress Notes (Signed)
This morning's CXR reveals minimal L pleural fluid and very small pneumothorax that is likely ex vacuo There has been minimal drainage in past 24 hrs  L chest tube removed without incident  OK for discharge to home  I have arranged for follow up with Dr Ashby Dawes on Fri of this week with a CXR on same day   I have provided him with my card to call if he develops any respiratory or pulmonary symptoms in the interim  Merton Border, MD PCCM service Mobile [phone] Pager [phone] 10/13/2015

## 2015-10-13 NOTE — Progress Notes (Signed)
MD order received to discharge pt home today; verbally reviewed AVS with pt including medications/gave Rxs for Percocet and antibiotic to pt; and gave him his return to work note; no questions voiced at this time; pt discharged via wheelchair by nursing to the emergency room entrance due to pt having his personal car in the parking lot there

## 2015-10-13 NOTE — Progress Notes (Signed)
Vincent Hoover was admitted to the Hospital on 10/06/2015 and Discharged  10/13/2015 and should be excused from work/school   for 14  days starting 10/06/2015 , may return to work/school without any restrictions.  Call Abel Presto MD, [facility]  [phone] with questions.  Henreitta Leber M.D on 10/13/2015,at 3:19 PM

## 2015-10-14 ENCOUNTER — Telehealth: Payer: Self-pay

## 2015-10-14 DIAGNOSIS — J90 Pleural effusion, not elsewhere classified: Secondary | ICD-10-CM

## 2015-10-14 DIAGNOSIS — J189 Pneumonia, unspecified organism: Secondary | ICD-10-CM

## 2015-10-14 NOTE — Telephone Encounter (Signed)
Wilhelmina Mcardle, MD  Renelda Mom, LPN        Please schedule office follow up with DR on Fri @ noon as overbook as necessary - it will be a quick follow up. He needs CXR prior to visit   Thanks    Pt is scheduled for 9:45 on 10/17/15 with patient to arrive at 9:30 with cxr prior. cxr ordered. Pt aware. Nothing further needed.

## 2015-10-16 NOTE — Progress Notes (Deleted)
* [facility]     Assessment and Plan:  Empyema. --Status post instillation of TPA/pulmozyme combination x 6 doses via chest tube.    -Some degree of atelectasis/lung entrapment due to above, will likely be chronic.   Pleuritic Chest pain.  --Secondary to above, improving.  -Continue prn pain meds. Minimize use of toradol.   Pneumonia.  --LLL pneumonia, continue abx.   Nicotine Abuse.  --Likely contributory, discussed smoking cessation    Date: 10/16/2015  MRN# [id number] Vincent Hoover. 15-Jul-1965   [name] is a 50 y.o. old male seen in follow up for chief complaint of  No chief complaint on file.    HPI:   The patient is a 50 yo male seen recently in the hospital for left empyema with loculated fluid and lung entrapment. This was treated with TPA/pulmozyme with mild residual entrapment, he was discharged on abx.   Medication:   Outpatient Encounter Prescriptions as of 10/17/2015  Medication Sig  . amoxicillin-clavulanate (AUGMENTIN) 875-125 MG tablet Take 1 tablet by mouth 2 (two) times daily.  Marland Kitchen oxyCODONE-acetaminophen (PERCOCET/ROXICET) 5-325 MG tablet Take 1-2 tablets by mouth every 4 (four) hours as needed for moderate pain.   No facility-administered encounter medications on file as of 10/17/2015.      Allergies:  Bee venom  Review of Systems: Gen:  Denies  fever, sweats. HEENT: Denies blurred vision. Cvc:  No dizziness, chest pain or heaviness Resp:   Denies cough or sputum porduction. Gi: Denies swallowing difficulty, stomach pain. constipation, bowel incontinence Gu:  Denies bladder incontinence, burning urine Ext:   No Joint pain, stiffness. Skin: No skin rash, easy bruising. Endoc:  No polyuria, polydipsia. Psych: No depression, insomnia. Other:  All other systems were reviewed and found to be negative other than what is mentioned in the HPI.   Physical Examination:   VS: There were no vitals taken for this  visit.  General Appearance: No distress  Neuro:without focal findings,  speech normal,  HEENT: PERRLA, EOM intact. Pulmonary: normal breath sounds, No wheezing.   CardiovascularNormal S1,S2.  No m/r/g.   Abdomen: Benign, Soft, non-tender. Renal:  No costovertebral tenderness  GU:  Not performed at this time. Endoc: No evident thyromegaly, no signs of acromegaly. Skin:   warm, no rash. Extremities: normal, no cyanosis, clubbing.   LABORATORY PANEL:   CBC  Recent Labs Lab 10/11/15 0527  WBC 12.7*  HGB 12.4*  HCT 35.9*  PLT 655*   ------------------------------------------------------------------------------------------------------------------  Chemistries   Recent Labs Lab 10/12/15 0419  NA 138  K 3.9  CL 103  CO2 29  GLUCOSE 100*  BUN 15  CREATININE 0.61  CALCIUM 8.4*   ------------------------------------------------------------------------------------------------------------------  Cardiac Enzymes No results for input(s): TROPONINI in the last 168 hours. ------------------------------------------------------------  RADIOLOGY:   No results found for this or any previous visit. Results for orders placed during the hospital encounter of 10/06/15  DG Chest 2 View   Narrative CLINICAL DATA:  Chest tube.  Pleural effusion.  EXAM: CHEST  2 VIEW  COMPARISON:  10/09/2015.  FINDINGS: Two-view exam shows a a pigtail catheter overlying the posterior aspect of the lower left hemi thorax. Left pleural effusion appears slightly decreased in the interval. No evidence for pneumothorax. Both lung bases are better aerated than on the previous study. The cardiopericardial silhouette is within normal limits for size. The visualized bony structures of the thorax are intact.  IMPRESSION: Improved aeration with slight decrease in left pleural effusion.  Electronically Signed   By: Misty Stanley M.D.   On: 10/10/2015 21:17     ------------------------------------------------------------------------------------------------------------------  Thank  you for allowing [facility] to assist in the care of your patient. Our recommendations are noted above.  Please contact us if we can be of further service.   Marda Stalker, MD.  [facility] Office Number: [phone]  Patricia Pesa, M.D.  Vilinda Boehringer, M.D.  Merton Border, M.D  10/16/2015

## 2015-10-17 ENCOUNTER — Institutional Professional Consult (permissible substitution): Payer: Self-pay | Admitting: Internal Medicine

## 2015-10-17 ENCOUNTER — Encounter: Payer: Self-pay | Admitting: *Deleted

## 2015-10-29 ENCOUNTER — Inpatient Hospital Stay: Payer: Self-pay | Admitting: Internal Medicine

## 2016-03-22 ENCOUNTER — Ambulatory Visit (HOSPITAL_COMMUNITY)
Admission: EM | Admit: 2016-03-22 | Discharge: 2016-03-22 | Disposition: A | Discharge status: Home / Self Care | Payer: Medicaid Other | Attending: Internal Medicine | Admitting: Internal Medicine

## 2016-03-22 ENCOUNTER — Ambulatory Visit (INDEPENDENT_AMBULATORY_CARE_PROVIDER_SITE_OTHER): Payer: Medicaid Other

## 2016-03-22 DIAGNOSIS — J40 Bronchitis, not specified as acute or chronic: Secondary | ICD-10-CM | POA: Diagnosis not present

## 2016-03-22 DIAGNOSIS — J440 Chronic obstructive pulmonary disease with acute lower respiratory infection: Secondary | ICD-10-CM | POA: Diagnosis not present

## 2016-03-22 MED ORDER — AZITHROMYCIN 250 MG PO TABS: 250.0000 mg | ORAL_TABLET | Freq: Every day | ORAL | 0 refills | Status: AC

## 2016-03-22 NOTE — ED Triage Notes (Signed)
C/o cold sx for a couple of weeks States he was dx with pneumonia in august and feels the same as then  No pcp

## 2016-03-22 NOTE — Discharge Instructions (Signed)
Recommend start Zithromax as directed. Increase fluid intake to help loosen any mucus in lungs. Follow-up with a primary care provider in 4 to 5 days if not improving.

## 2016-03-22 NOTE — ED Provider Notes (Signed)
CSN: [id number]     Arrival date & time 03/22/16  1023 History   First MD Initiated Contact with Patient 03/22/16 1218     Chief Complaint  Patient presents with  . URI   (Consider location/radiation/quality/duration/timing/severity/associated sxs/prior Treatment) 51 year old male presents with cough, chest congestion and left sided lower chest discomfort for over 2 weeks. Had similar symptoms back in July 2017 and had gone to 3 different hospitals and been on 2 different antibiotics when he was diagnosed with pneumonia, pleural effusion and collapsed lung in August 2017. He was treated in the hospital for 8 days and then told to follow-up with a PCP/Pulmonologist. He missed his follow-up appointment and now is concerned about return of pneumonia. He denies any fever, nasal congestion or GI symptoms. He continues to smoke daily. He is accompanied by both sons who have presumptive influenza.  He has multiple questions concerning his health as well as his son's.    The history is provided by the patient.    Past Medical History:  Diagnosis Date  . Arthritis   . Cancer ([place])   . Melanoma [facility])    Past Surgical History:  Procedure Laterality Date  . TONSILLECTOMY    . WISDOM TOOTH EXTRACTION     Family History  Problem Relation Age of Onset  . Diabetes Maternal Grandfather   . Cancer Paternal Uncle   . Cancer Paternal Aunt    Social History  Substance Use Topics  . Smoking status: Current Every Day Smoker    Packs/day: 0.50    Types: Cigarettes  . Smokeless tobacco: Never Used  . Alcohol use No    Review of Systems  Constitutional: Positive for fatigue. Negative for appetite change, chills, fever and unexpected weight change.  HENT: Negative for congestion, ear pain, postnasal drip, sinus pain, sinus pressure and sore throat.   Eyes: Negative for discharge.  Respiratory: Positive for cough and chest tightness. Negative for shortness of breath and wheezing.   Cardiovascular:  Positive for chest pain.  Gastrointestinal: Negative for abdominal pain, blood in stool, diarrhea, nausea and vomiting.  Genitourinary: Negative for difficulty urinating.  Musculoskeletal: Negative for arthralgias, back pain and myalgias.  Skin: Negative for rash.  Neurological: Negative for dizziness, syncope, weakness, light-headedness and headaches.  Hematological: Negative for adenopathy.    Allergies  Bee venom  Home Medications   Prior to Admission medications   Medication Sig Start Date End Date Taking? Authorizing Provider  azithromycin (ZITHROMAX) 250 MG tablet Take 1 tablet (250 mg total) by mouth daily. Take first 2 tablets together, then 1 every day until finished. 03/22/16   Katy Apo, NP   Meds Ordered and Administered this Visit  Medications - No data to display  BP 125/79 (BP Location: Left Arm)   Pulse 77   Temp 98.2 F (36.8 C) (Oral)   Resp 16   SpO2 100%  No data found.   Physical Exam  Constitutional: He is oriented to person, place, and time. He appears well-developed and well-nourished. No distress.  HENT:  Head: Normocephalic and atraumatic.  Right Ear: Hearing, tympanic membrane, external ear and ear canal normal.  Left Ear: Hearing, tympanic membrane, external ear and ear canal normal.  Nose: Nose normal. Right sinus exhibits no maxillary sinus tenderness and no frontal sinus tenderness. Left sinus exhibits no maxillary sinus tenderness and no frontal sinus tenderness.  Mouth/Throat: Uvula is midline and mucous membranes are normal. Posterior oropharyngeal erythema present.  Neck: Normal range  of motion. Neck supple.  Cardiovascular: Normal rate, regular rhythm and normal heart sounds.   No murmur heard. Pulmonary/Chest: Effort normal. No respiratory distress. He has decreased breath sounds in the right upper field and the left upper field. He has wheezes in the right upper field and the left upper field. He has no rhonchi. He has no rales.   Lymphadenopathy:    He has no cervical adenopathy.  Neurological: He is alert and oriented to person, place, and time.  Skin: Skin is warm and dry. Capillary refill takes less than 2 seconds.  Psychiatric: He has a normal mood and affect. His behavior is normal. Judgment and thought content normal.    Urgent Care Course     Procedures (including critical care time)  Labs Review Labs Reviewed - No data to display  Imaging Review Dg Chest 2 View  Result Date: 03/22/2016 CLINICAL DATA:  51 year old male with cough congestion and left chest pain for over 2 weeks. Shortness of breath. Previous hospital visits for chest pain. Underwent left pleural drainage catheter in August 2017. Smoker. Initial encounter. EXAM: CHEST  2 VIEW COMPARISON:  10/13/2015 and earlier. FINDINGS: Since the two view chest study on 10/10/2015 the left pleural drain has been removed. There is mild residual blunting of the left costophrenic angle but otherwise normalized left lung base ventilation since that time. Mediastinal contours are stable. Visualized tracheal air column is within normal limits. Underlying somewhat large lung volumes with mild diffuse increased interstitial markings. No pneumothorax, pulmonary edema, new pleural effusion or confluent pulmonary opacity. Osteopenia. Negative visible bowel gas pattern. IMPRESSION: Satisfactory appearance of the left lung base status post a period of percutaneous left pleural drainage in August 2017. Chronic pulmonary hyperinflation, with a degree of upper lobe centrilobular emphysema demonstrated on 2017 CT. No acute cardiopulmonary abnormality. Electronically Signed   By: Genevie Luetta Piazza M.D.   On: 03/22/2016 13:13     Visual Acuity Review  Right Eye Distance:   Left Eye Distance:   Bilateral Distance:    Right Eye Near:   Left Eye Near:    Bilateral Near:         MDM   1. Bronchitis   2. Chronic obstructive pulmonary disease with acute lower respiratory  infection ([place])    Reviewed chest x-ray results with patient. Discussed COPD and bronchitis. Recommend start Zithromax as directed. Increase fluid to help loosen any mucus in chest. Encouraged to quit smoking. Note written for work. Answered questions regarding clinical findings, test results and appropriate next steps of care for himself and his sons for over 30 minutes. Recommend follow-up with a primary care provider in 5 to 7 days if not improving or go to ER if symptoms worsen.     Katy Apo, NP 03/22/16 [phone]

## 2016-09-09 ENCOUNTER — Encounter: Payer: Self-pay | Admitting: Emergency Medicine

## 2016-09-09 ENCOUNTER — Emergency Department: Payer: Medicaid Other

## 2016-09-09 ENCOUNTER — Emergency Department
Admission: EM | Admit: 2016-09-09 | Discharge: 2016-09-09 | Disposition: A | Discharge status: Home / Self Care | Payer: Medicaid Other | Attending: Emergency Medicine | Admitting: Emergency Medicine

## 2016-09-09 DIAGNOSIS — R05 Cough: Secondary | ICD-10-CM | POA: Diagnosis not present

## 2016-09-09 DIAGNOSIS — F17210 Nicotine dependence, cigarettes, uncomplicated: Secondary | ICD-10-CM | POA: Diagnosis not present

## 2016-09-09 DIAGNOSIS — R6883 Chills (without fever): Secondary | ICD-10-CM | POA: Diagnosis not present

## 2016-09-09 DIAGNOSIS — J069 Acute upper respiratory infection, unspecified: Secondary | ICD-10-CM | POA: Diagnosis not present

## 2016-09-09 DIAGNOSIS — M791 Myalgia: Secondary | ICD-10-CM | POA: Diagnosis present

## 2016-09-09 MED ORDER — PSEUDOEPH-BROMPHEN-DM 30-2-10 MG/5ML PO SYRP: 5.0000 mL | ORAL_SOLUTION | Freq: Four times a day (QID) | ORAL | 0 refills | Status: AC | PRN

## 2016-09-09 MED ORDER — IBUPROFEN 800 MG PO TABS: 800.0000 mg | ORAL_TABLET | Freq: Three times a day (TID) | ORAL | 0 refills | Status: AC | PRN

## 2016-09-09 NOTE — ED Triage Notes (Signed)
Patient ambulatory to triage with steady gait, without difficulty or distress noted; st x 2 days having chills, body aches, prod cough green sputum

## 2016-09-09 NOTE — ED Notes (Signed)
Pt c/o cough and back pain, hx of chest tube placement in the past for similar sxs.  Pt states he hasn't been feeling well for about 2 weeks, but worsened on Tuesday.  Pt in no distress at this time. Ambulatory without difficulty.

## 2016-09-09 NOTE — ED Provider Notes (Signed)
[facility] Emergency Department Provider Note   ____________________________________________   First MD Initiated Contact with Patient 09/09/16 [phone]     (approximate)  I have reviewed the triage vital signs and the nursing notes.   HISTORY  Chief Complaint Generalized Body Aches; Chills; and Cough    HPI Vincent Hoover. is a 51 y.o. male patient complaining of 3 days of shortness of breath, chills, body ache and productive green sputum. Patient also complaining of nausea, vomiting. Patient rates his pain discomfort as 7/10. No palliative measures for complaint. Patient does smoke and a history recurrent pneumonia,.  Past Medical History:  Diagnosis Date  . Arthritis   . Cancer ([place])   . Melanoma [facility])     Patient Active Problem List   Diagnosis Date Noted  . Pleural effusion 10/06/2015  . Recurrent pneumonia 10/06/2015  . Pneumonia 10/06/2015    Past Surgical History:  Procedure Laterality Date  . TONSILLECTOMY    . WISDOM TOOTH EXTRACTION      Prior to Admission medications   Medication Sig Start Date End Date Taking? Authorizing Provider  azithromycin (ZITHROMAX) 250 MG tablet Take 1 tablet (250 mg total) by mouth daily. Take first 2 tablets together, then 1 every day until finished. 03/22/16   Katy Apo, NP  brompheniramine-pseudoephedrine-DM 30-2-10 MG/5ML syrup Take 5 mLs by mouth 4 (four) times daily as needed. 09/09/16   Sable Feil, PA-C  ibuprofen (ADVIL,MOTRIN) 800 MG tablet Take 1 tablet (800 mg total) by mouth every 8 (eight) hours as needed for moderate pain. 09/09/16   Sable Feil, PA-C    Allergies Bee venom  Family History  Problem Relation Age of Onset  . Diabetes Maternal Grandfather   . Cancer Paternal Uncle   . Cancer Paternal Aunt     Social History Social History  Substance Use Topics  . Smoking status: Current Every Day Smoker    Packs/day: 0.50    Types: Cigarettes  . Smokeless tobacco:  Never Used  . Alcohol use No    Review of Systems  Constitutional: Fever/chills and body aches Eyes: No visual changes. ENT: No sore throat. Cardiovascular: Denies chest pain. Respiratory:  shortness of breath And productive cough Gastrointestinal: No abdominal pain.  No nausea, no vomiting.  No diarrhea.  No constipation. Genitourinary: Negative for dysuria. Musculoskeletal: Negative for back pain. Skin: Negative for rash. Neurological: Negative for headaches, focal weakness or numbness. Allergic/Immunilogical: Bee sting ____________________________________________   PHYSICAL EXAM:  VITAL SIGNS: ED Triage Vitals  Enc Vitals Group     BP 09/09/16 0642 (!) 147/100     Pulse Rate 09/09/16 0642 87     Resp 09/09/16 0642 20     Temp 09/09/16 0642 97.6 F (36.4 C)     Temp Source 09/09/16 0642 Oral     SpO2 09/09/16 0642 100 %     Weight 09/09/16 0642 160 lb (72.6 kg)     Height 09/09/16 0642 5\' 11"  (1.803 m)     Head Circumference --      Peak Flow --      Pain Score 09/09/16 0641 7     Pain Loc --      Pain Edu? --      Excl. in [place]? --     Constitutional: Alert and oriented. Well appearing and in no acute distress. Nose: No congestion/rhinnorhea. Mouth/Throat: Mucous membranes are moist.  Oropharynx non-erythematous. Neck: No stridor.  Hematological/Lymphatic/Immunilogical: No cervical lymphadenopathy. Cardiovascular:  Normal rate, regular rhythm. Grossly normal heart sounds.  Good peripheral circulation.Elevated blood pressure Respiratory: Normal respiratory effort.  No retractions. Lungs CTAB. Neurologic:  Normal speech and language. No gross focal neurologic deficits are appreciated. No gait instability. Skin:  Skin is warm, dry and intact. No rash noted. Psychiatric: Mood and affect are normal. Speech and behavior are normal.  ____________________________________________   LABS (all labs ordered are listed, but only abnormal results are displayed)  Labs  Reviewed - No data to display ____________________________________________  EKG   ____________________________________________  RADIOLOGY  Dg Chest 2 View  Result Date: 09/09/2016 CLINICAL DATA:  Chills, body aches, and productive cough with left lower chest pain for the past 2 days. Current smoker. EXAM: CHEST  2 VIEW COMPARISON:  PA and lateral chest x-ray of March 22, 2016 FINDINGS: The lungs remain hyperinflated. There is stable minimal blunting of the left lateral and posterior costophrenic angles. The heart and pulmonary vascularity are normal. The mediastinum is normal in width. There is calcification in the wall of the aortic arch. There is no pleural effusion. The bony thorax exhibits no acute abnormality. IMPRESSION: Chronic bronchitic changes.  No alveolar pneumonia nor CHF. Thoracic aortic atherosclerosis. Electronically Signed   By: David  Martinique M.D.   On: 09/09/2016 07:45    _Chest x-ray shows chronic bronchitis but no other acute findings. ___________________________________________   PROCEDURES  Procedure(s) performed: None  Procedures  Critical Care performed: No  ____________________________________________   INITIAL IMPRESSION / ASSESSMENT AND PLAN / ED COURSE  Pertinent labs & imaging results that were available during my care of the patient were reviewed by me and considered in my medical decision making (see chart for details).  Viral respiratory infection. Discussed x-ray finding with patient. Patient given discharge care instructions. Patient given a work note. Patient advised follow-up with PCP if condition persists.      ____________________________________________   FINAL CLINICAL IMPRESSION(S) / ED DIAGNOSES  Final diagnoses:  Upper respiratory tract infection, unspecified type      NEW MEDICATIONS STARTED DURING THIS VISIT:  New Prescriptions   BROMPHENIRAMINE-PSEUDOEPHEDRINE-DM 30-2-10 MG/5ML SYRUP    Take 5 mLs by mouth 4 (four)  times daily as needed.   IBUPROFEN (ADVIL,MOTRIN) 800 MG TABLET    Take 1 tablet (800 mg total) by mouth every 8 (eight) hours as needed for moderate pain.     Note:  This document was prepared using Dragon voice recognition software and may include unintentional dictation errors.    Sable Feil, PA-C 09/09/16 0757    Carrie Mew, MD 09/10/16 2020

## 2016-12-02 ENCOUNTER — Emergency Department (HOSPITAL_COMMUNITY)
Admission: EM | Admit: 2016-12-02 | Discharge: 2016-12-02 | Disposition: A | Discharge status: Home / Self Care | Payer: Self-pay | Attending: Emergency Medicine | Admitting: Emergency Medicine

## 2016-12-02 ENCOUNTER — Emergency Department (HOSPITAL_COMMUNITY): Payer: Self-pay

## 2016-12-02 ENCOUNTER — Encounter (HOSPITAL_COMMUNITY): Payer: Self-pay | Admitting: Emergency Medicine

## 2016-12-02 DIAGNOSIS — Z85820 Personal history of malignant melanoma of skin: Secondary | ICD-10-CM | POA: Insufficient documentation

## 2016-12-02 DIAGNOSIS — F17210 Nicotine dependence, cigarettes, uncomplicated: Secondary | ICD-10-CM | POA: Insufficient documentation

## 2016-12-02 DIAGNOSIS — R079 Chest pain, unspecified: Secondary | ICD-10-CM | POA: Insufficient documentation

## 2016-12-02 LAB — BASIC METABOLIC PANEL
Anion gap: 9 (ref 5–15)
BUN: 13 mg/dL (ref 6–20)
CALCIUM: 9.5 mg/dL (ref 8.9–10.3)
CO2: 28 mmol/L (ref 22–32)
CREATININE: 0.9 mg/dL (ref 0.61–1.24)
Chloride: 102 mmol/L (ref 101–111)
GFR calc Af Amer: >60 mL/min (ref >60)
GLUCOSE: 116 mg/dL — AB (ref 65–99)
Potassium: 4.2 mmol/L (ref 3.5–5.1)
Sodium: 139 mmol/L (ref 135–145)

## 2016-12-02 LAB — CBC
HEMATOCRIT: 47.5 % (ref 39.0–52.0)
Hemoglobin: 16 g/dL (ref 13.0–17.0)
MCH: 30.5 pg (ref 26.0–34.0)
MCHC: 33.7 g/dL (ref 30.0–36.0)
MCV: 90.5 fL (ref 78.0–100.0)
Platelets: 284 10*3/uL (ref 150–400)
RBC: 5.25 MIL/uL (ref 4.22–5.81)
RDW: 13.5 % (ref 11.5–15.5)
WBC: 13.8 10*3/uL — ABNORMAL HIGH (ref 4.0–10.5)

## 2016-12-02 LAB — I-STAT TROPONIN, ED
TROPONIN I, POC: 0 ng/mL (ref 0.00–0.08)
Troponin i, poc: 0 ng/mL (ref 0.00–0.08)

## 2016-12-02 LAB — D-DIMER, QUANTITATIVE: D-Dimer, Quant: 0.27 ug/mL-FEU (ref 0.00–0.50)

## 2016-12-02 MED ORDER — ASPIRIN 81 MG PO CHEW
324.0000 mg | CHEWABLE_TABLET | Freq: Once | ORAL | Status: AC
  Administered 2016-12-02: 324 mg via ORAL
  Filled 2016-12-02: qty 4

## 2016-12-02 NOTE — ED Notes (Signed)
Patient transported to X-ray 

## 2016-12-02 NOTE — Discharge Instructions (Signed)
Please contact the [facility] to establish a primary care physician.  Please consider tobacco cessation for your health long-term.  Return to the ED as needed.

## 2016-12-02 NOTE — ED Notes (Signed)
Patient states he was walking at work and started having chest pain  the pain radiated to his left shoulder and his left leg started feeling different. C/o sob . States 1 1/2  Yrs ago similar thing happened and he had fluid on his lung and had to have a chest tube.

## 2016-12-02 NOTE — ED Triage Notes (Signed)
To ed VIA private vehicle with c/o chest pain-- started while walking today-- took tums, thinking it was heartburn, no relief. Has pressure 7/10 midsternal-- no hx of cardiac problems

## 2016-12-02 NOTE — ED Provider Notes (Signed)
[facility] EMERGENCY DEPARTMENT Provider Note   CSN: [id number] Arrival date & time: 12/02/16  5277  History   Chief Complaint Chief Complaint  Patient presents with  . Chest Pain   HPI Vincent Hoover. is a 51 y.o. male.  The patient is a 51 year old male with a medical history significant for melanoma (right lateral neck), recurrent pneumonia, pleural effusion requiring chest tube (1.35yr ago), and arthritis, who presents to the ED complaining of chest pain.  The patient's pain began suddenly this morning while he was at work. The pain was initially 10 out of 10 in severity and has decreased to 7 out of 10 at this time. The pain was initially worse in the left side and is now in the center of his chest. It does not radiate to his arm, back, or jaw.  He states it feels as if, "there is a basketball pressing onto my chest."  He took Tums without relief of symptoms. He is worried that he has another pleural effusion, because his prior effusion present similarly. He is also complaining of pain in his left hip and left calf that began today. No history of PE or DVT. No exogenous estrogen use or prolonged travel. The patient is an active smoker and is trying to cut back. He has had a nonproductive cough for the past several weeks but no fevers or sputum production.   The history is provided by the patient and medical records.   Past Medical History:  Diagnosis Date  . Arthritis   . Cancer ([place])   . Melanoma [facility])    Patient Active Problem List   Diagnosis Date Noted  . Pleural effusion 10/06/2015  . Recurrent pneumonia 10/06/2015  . Pneumonia 10/06/2015   Past Surgical History:  Procedure Laterality Date  . TONSILLECTOMY    . WISDOM TOOTH EXTRACTION      Home Medications    Prior to Admission medications   Medication Sig Start Date End Date Taking? Authorizing Provider  azithromycin (ZITHROMAX) 250 MG tablet Take 1 tablet (250 mg total) by mouth daily.  Take first 2 tablets together, then 1 every day until finished. 03/22/16   Katy Apo, NP  brompheniramine-pseudoephedrine-DM 30-2-10 MG/5ML syrup Take 5 mLs by mouth 4 (four) times daily as needed. 09/09/16   Sable Feil, PA-C  ibuprofen (ADVIL,MOTRIN) 800 MG tablet Take 1 tablet (800 mg total) by mouth every 8 (eight) hours as needed for moderate pain. 09/09/16   Sable Feil, PA-C   Family History Family History  Problem Relation Age of Onset  . Diabetes Maternal Grandfather   . Cancer Paternal Uncle   . Cancer Paternal Aunt    Social History Social History  Substance Use Topics  . Smoking status: Current Every Day Smoker    Packs/day: 0.50    Types: Cigarettes  . Smokeless tobacco: Never Used  . Alcohol use No    Allergies   Bee venom  Review of Systems Review of Systems  Constitutional: Negative for chills and fever.  HENT: Negative.   Eyes: Negative.   Respiratory: Positive for cough. Negative for shortness of breath.   Cardiovascular: Positive for chest pain.  Gastrointestinal: Negative for abdominal pain, diarrhea, nausea and vomiting.  Genitourinary: Negative.   Skin: Negative.   Allergic/Immunologic: Negative for immunocompromised state.  Neurological: Negative for dizziness.  Hematological: Negative.   Psychiatric/Behavioral: Negative.    Physical Exam Updated Vital Signs BP 130/78 (BP Location: Right Arm)  Pulse 62   Temp (!) 97.5 F (36.4 C) (Oral)   Resp 14   Ht 6' (1.829 m)   Wt 74.8 kg (165 lb)   SpO2 99%   BMI 22.38 kg/m   Physical Exam  Constitutional: He is oriented to person, place, and time. He appears well-developed and well-nourished.  HENT:  Head: Normocephalic and atraumatic.  Mouth/Throat: Oropharynx is clear and moist.  Eyes: Conjunctivae are normal.  Neck: Neck supple. No JVD present.  Cardiovascular: Normal rate, regular rhythm, normal heart sounds and intact distal pulses.   No murmur heard. 2+ radial pulses  bilaterally  Pulmonary/Chest: Effort normal and breath sounds normal. No respiratory distress. He has no wheezes.  Abdominal: Soft. He exhibits no distension. There is no tenderness. There is no guarding.  Musculoskeletal: He exhibits no edema.  Neurological: He is alert and oriented to person, place, and time.  Skin: Skin is warm and dry.  Psychiatric: He has a normal mood and affect. His behavior is normal. Judgment and thought content normal.  Nursing note and vitals reviewed.   ED Treatments / Results  Labs (all labs ordered are listed, but only abnormal results are displayed) Labs Reviewed  BASIC METABOLIC PANEL - Abnormal; Notable for the following:       Result Value   Glucose, Bld 116 (*)    All other components within normal limits  CBC - Abnormal; Notable for the following:    WBC 13.8 (*)    All other components within normal limits  D-DIMER, QUANTITATIVE (NOT AT [facility])  I-STAT TROPONIN, ED  I-STAT TROPONIN, ED    EKG  EKG Interpretation  Date/Time:  Thursday December 02 2016 09:57:43 EDT Ventricular Rate:  83 PR Interval:    QRS Duration: 84 QT Interval:  348 QTC Calculation: 408 R Axis:   74 Text Interpretation:  Sinus rhythm Artifact otherwise similar to prior Incomplete right bundle branch block Abnormal ekg Confirmed by Carmin Muskrat [id number]) on 12/02/2016 10:58:34 AM      Radiology Dg Chest 2 View  Result Date: 12/02/2016 CLINICAL DATA:  Started having chest pain at 7 a.m. Fell like heart burn. EXAM: CHEST  2 VIEW COMPARISON:  09/09/2016 FINDINGS: The heart size and mediastinal contours are within normal limits. Both lungs are clear. The visualized skeletal structures are unremarkable. IMPRESSION: No active cardiopulmonary disease. Electronically Signed   By: Kathreen Devoid   On: 12/02/2016 10:28    Procedures Procedures (including critical care time)  Medications Ordered in ED Medications  aspirin chewable tablet 324 mg (324 mg Oral Given 12/02/16  1045)   Initial Impression / Assessment and Plan / ED Course  I have reviewed the triage vital signs and the nursing notes.  Pertinent labs & imaging results that were available during my care of the patient were reviewed by me and considered in my medical decision making (see chart for details).   Initial differential diagnosis included ACS, pleural effusion, PE, pneumonia, bronchitis, costochondritis, anxiety, musculoskeletal strain/sprain.  Pertinent labs included CBC with mild leukocytosis.  BMP unremarkable. D-dimer negative.  Repeat troponin negative.  EKG with normal HR (83 bpm) and NSR without axis deviation.  Normal PR, narrow QRS complex, and normal QTc.  RBBB noted.  No T wave or ST changes suggestive evidence of ischemia or infarct.  Imaging studies included a CXR with no acute cardiopulmonary abnormalities.  The patient was given 324mg  ASA for his pain.  Upon reassessment, his pain had decreased from 7/10 to 4/10.  Based on the above findings, I suspect the patient is most likely suffering from musculoskeletal chest pain at this time.  No evidence or clinical concerns to support pneumonia or effusion.  He did have a HEAR score of 4, based primarily on his description of the pain and significant smoking history.  Pneumonia less likely in the absence of cough and fever. No evidence of pleural effusion on chest x-ray. The patient's pain is not reproducible on palpation, decreasing my suspicion for costochondritis. I have a low suspicion for PE, particularly in light of a normal d-dimer.  I discussed the above results with the patient who verbalized understanding.  Return precautions and follow-up plans discussed including the importance of tobacco cessation as well as the importance of establishing a PCP.  The patient was discharged in stable condition.  Final Clinical Impressions(s) / ED Diagnoses   Final diagnoses:  Chest pain, unspecified type   New Prescriptions Discharge Medication  List as of 12/02/2016  1:47 PM       Charisse March, MD 12/02/16 1743    Carmin Muskrat, MD 12/03/16 1651

## 2019-03-13 IMAGING — DX DG CHEST 2V
2 series · 2 of 2 positions shown · non-contrast
Comparison: 09/09/2016

CLINICAL DATA: Started having chest pain at 7 a.m.. Fell like heart
burn.

EXAM:
CHEST  2 VIEW

[w chest pa]
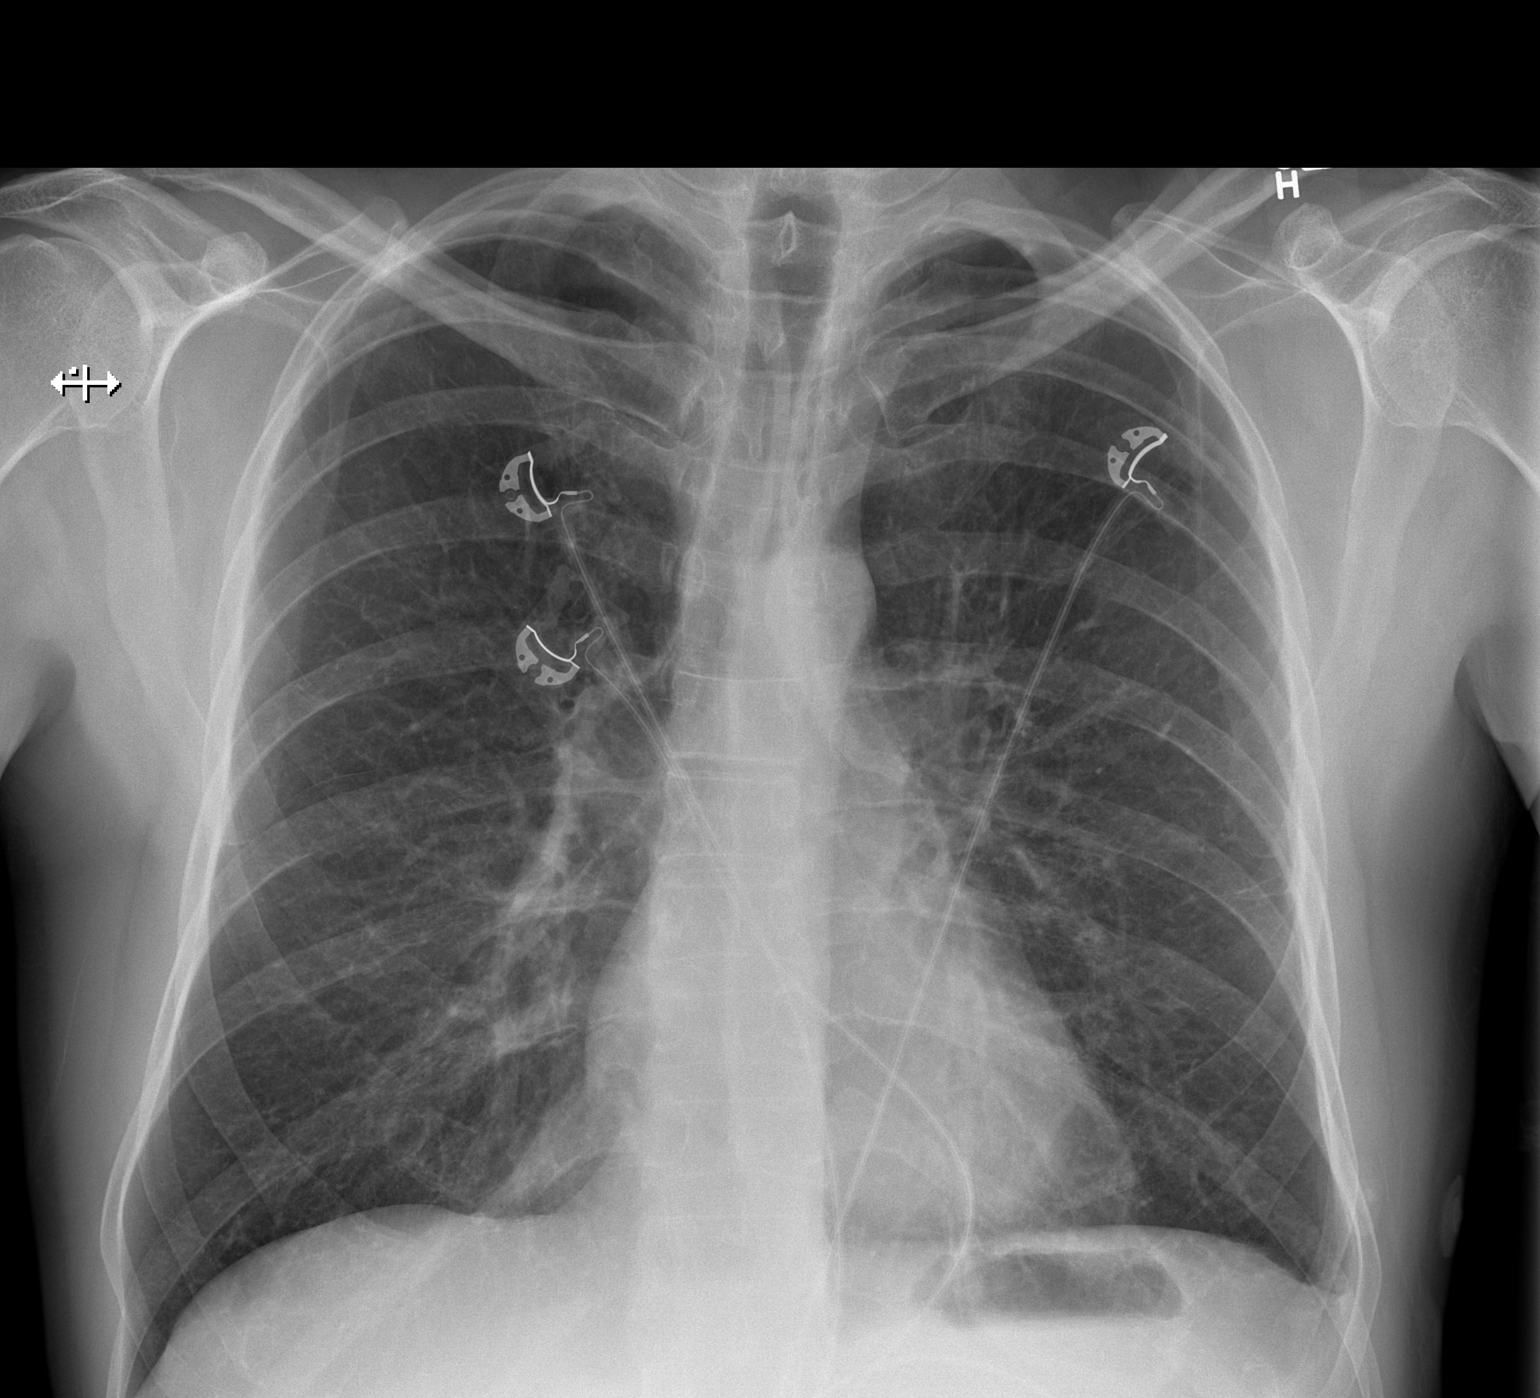

[w chest lat]
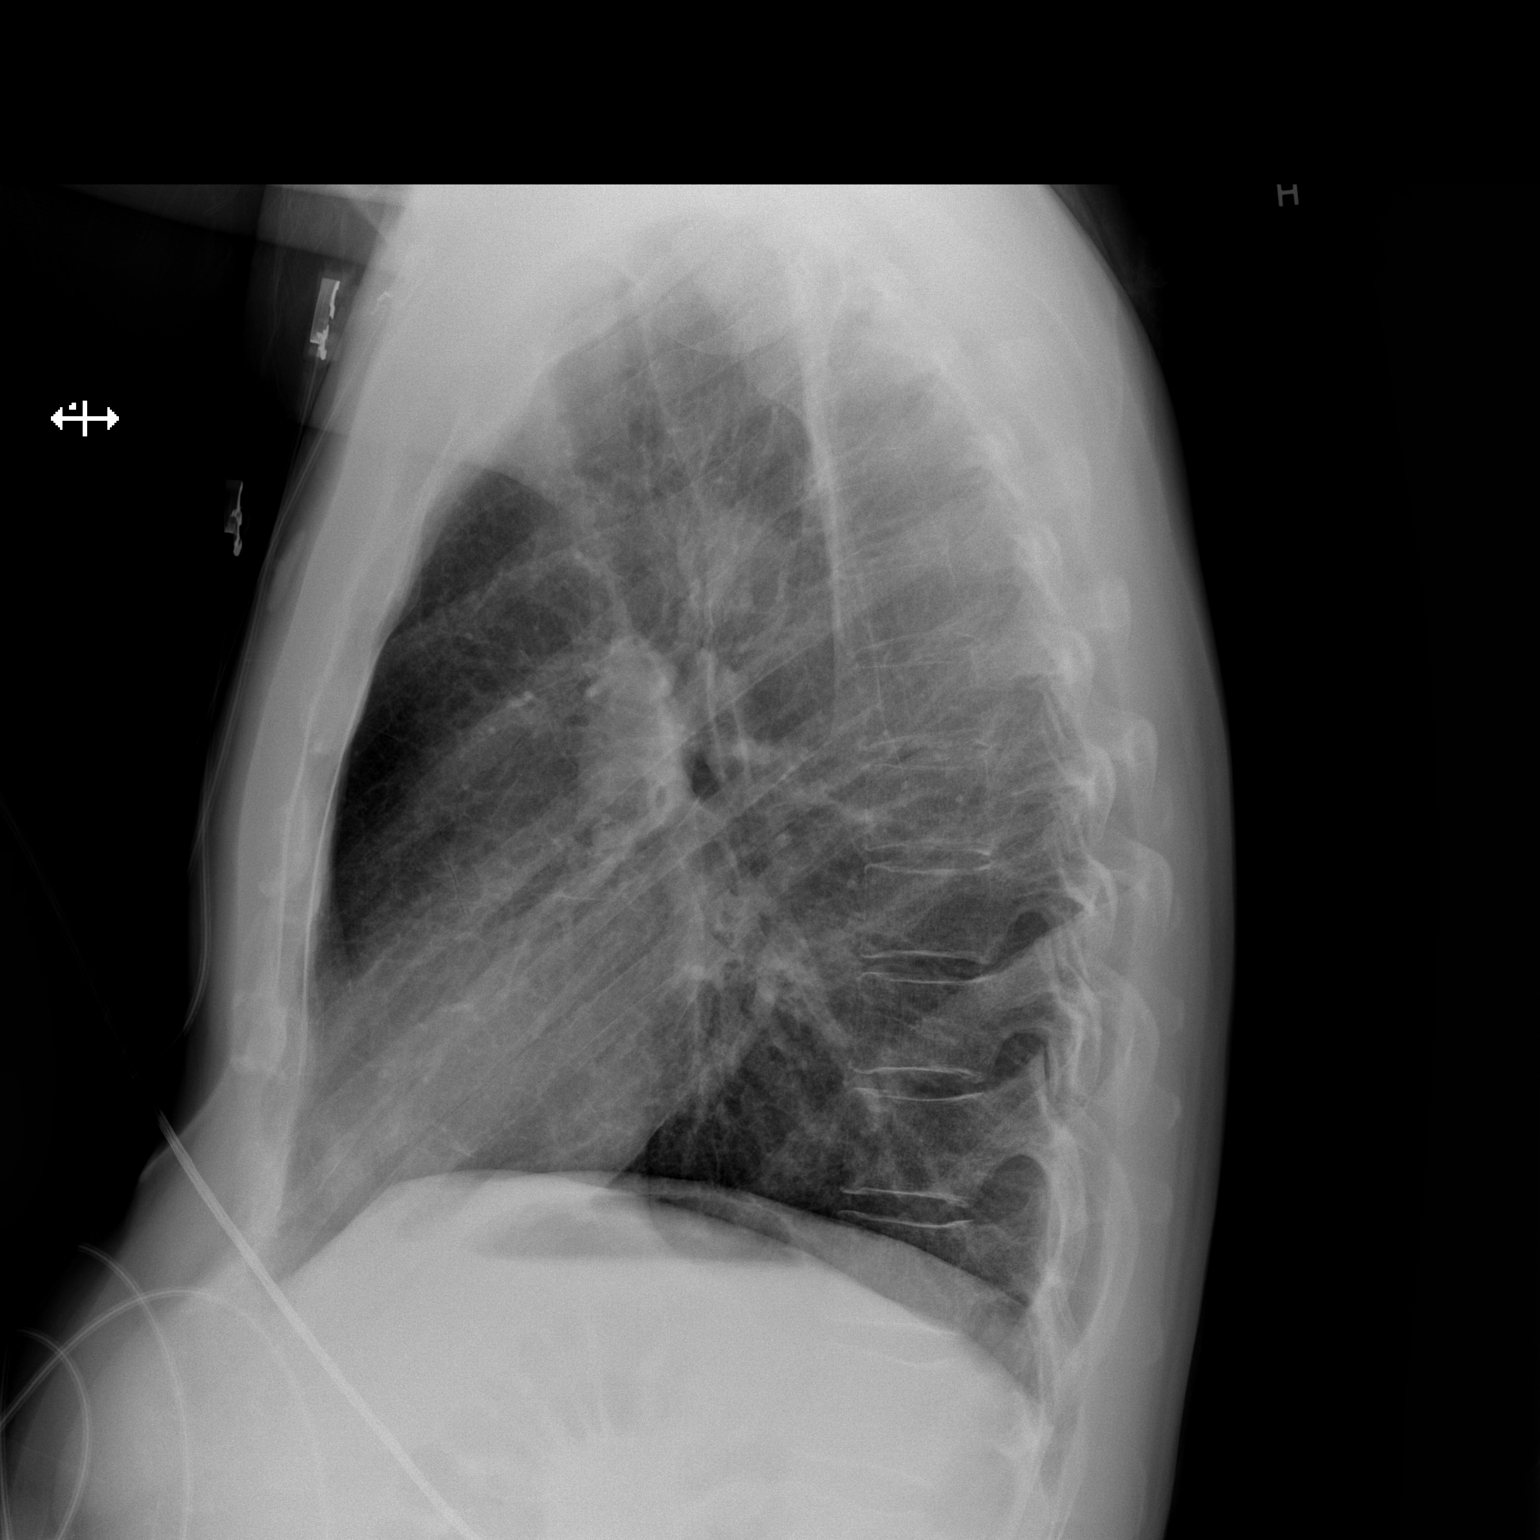

[2 of 2 positions shown; findings below may reference images not displayed]

FINDINGS: The heart size and mediastinal contours are within normal limits.
Both lungs are clear. The visualized skeletal structures are
unremarkable.
IMPRESSION: No active cardiopulmonary disease.
